# Patient Record
Sex: Male | Born: 1980 | Race: White | Hispanic: No | Smoking: Former smoker
Health system: Southern US, Community
[De-identification: ages and names within clinical notes are randomized; demographics above are authoritative.]

## PROBLEM LIST (undated history)

## (undated) DIAGNOSIS — I10 Essential (primary) hypertension: Secondary | ICD-10-CM

## (undated) DIAGNOSIS — F329 Major depressive disorder, single episode, unspecified: Secondary | ICD-10-CM

## (undated) DIAGNOSIS — F32A Depression, unspecified: Secondary | ICD-10-CM

---

## 2011-12-25 ENCOUNTER — Ambulatory Visit: Payer: Self-pay | Admitting: Family Medicine

## 2011-12-25 LAB — COMPREHENSIVE METABOLIC PANEL
Albumin: 3.9 g/dL (ref 3.4–5.0)
Alkaline Phosphatase: 73 U/L (ref 50–136)
Anion Gap: 10 (ref 7–16)
BUN: 11 mg/dL (ref 7–18)
Calcium, Total: 9.5 mg/dL (ref 8.5–10.1)
Creatinine: 0.92 mg/dL (ref 0.60–1.30)
Glucose: 105 mg/dL — ABNORMAL HIGH (ref 65–99)
Osmolality: 281 (ref 275–301)
Potassium: 4.4 mmol/L (ref 3.5–5.1)
SGPT (ALT): 92 U/L — ABNORMAL HIGH
Sodium: 141 mmol/L (ref 136–145)
Total Protein: 8.1 g/dL (ref 6.4–8.2)

## 2011-12-25 LAB — CBC WITH DIFFERENTIAL/PLATELET
Basophil %: 1.1 %
Eosinophil #: 0.3 10*3/uL (ref 0.0–0.7)
HCT: 45.4 % (ref 40.0–52.0)
HGB: 15.2 g/dL (ref 13.0–18.0)
Lymphocyte #: 3 10*3/uL (ref 1.0–3.6)
Lymphocyte %: 28.5 %
MCH: 29.3 pg (ref 26.0–34.0)
MCHC: 33.5 g/dL (ref 32.0–36.0)
MCV: 88 fL (ref 80–100)
Monocyte #: 1.2 x10 3/mm — ABNORMAL HIGH (ref 0.2–1.0)
RDW: 13.9 % (ref 11.5–14.5)
WBC: 10.6 10*3/uL (ref 3.8–10.6)

## 2011-12-25 LAB — TSH: Thyroid Stimulating Horm: 1.22 u[IU]/mL

## 2011-12-25 LAB — LIPID PANEL
Cholesterol: 180 mg/dL (ref 0–200)
HDL Cholesterol: 33 mg/dL — ABNORMAL LOW (ref 40–60)
Ldl Cholesterol, Calc: 124 mg/dL — ABNORMAL HIGH (ref 0–100)
VLDL Cholesterol, Calc: 23 mg/dL (ref 5–40)

## 2011-12-25 LAB — TROPONIN I: Troponin-I: <0.02 ng/mL

## 2011-12-31 ENCOUNTER — Ambulatory Visit: Payer: Self-pay | Admitting: Internal Medicine

## 2014-07-26 ENCOUNTER — Ambulatory Visit: Payer: Self-pay | Admitting: Physician Assistant

## 2014-08-17 ENCOUNTER — Ambulatory Visit: Payer: Self-pay | Admitting: Registered Nurse

## 2015-02-06 ENCOUNTER — Encounter: Payer: Self-pay | Admitting: Emergency Medicine

## 2015-02-06 ENCOUNTER — Emergency Department
Admission: EM | Admit: 2015-02-06 | Discharge: 2015-02-06 | Disposition: A | Discharge status: Home / Self Care | Payer: BLUE CROSS/BLUE SHIELD | Attending: Emergency Medicine | Admitting: Emergency Medicine

## 2015-02-06 DIAGNOSIS — S39012A Strain of muscle, fascia and tendon of lower back, initial encounter: Secondary | ICD-10-CM | POA: Diagnosis not present

## 2015-02-06 DIAGNOSIS — Y9289 Other specified places as the place of occurrence of the external cause: Secondary | ICD-10-CM | POA: Diagnosis not present

## 2015-02-06 DIAGNOSIS — Z880 Allergy status to penicillin: Secondary | ICD-10-CM | POA: Insufficient documentation

## 2015-02-06 DIAGNOSIS — Y9389 Activity, other specified: Secondary | ICD-10-CM | POA: Diagnosis not present

## 2015-02-06 DIAGNOSIS — Y998 Other external cause status: Secondary | ICD-10-CM | POA: Diagnosis not present

## 2015-02-06 DIAGNOSIS — M545 Low back pain: Secondary | ICD-10-CM | POA: Diagnosis present

## 2015-02-06 DIAGNOSIS — Z87891 Personal history of nicotine dependence: Secondary | ICD-10-CM | POA: Diagnosis not present

## 2015-02-06 DIAGNOSIS — X58XXXA Exposure to other specified factors, initial encounter: Secondary | ICD-10-CM | POA: Insufficient documentation

## 2015-02-06 LAB — URINALYSIS COMPLETE WITH MICROSCOPIC (ARMC ONLY)
BILIRUBIN URINE: NEGATIVE
Bacteria, UA: NONE SEEN
GLUCOSE, UA: NEGATIVE mg/dL
Hgb urine dipstick: NEGATIVE
KETONES UR: NEGATIVE mg/dL
Leukocytes, UA: NEGATIVE
Nitrite: NEGATIVE
Protein, ur: NEGATIVE mg/dL
SPECIFIC GRAVITY, URINE: 1.027 (ref 1.005–1.030)
Squamous Epithelial / LPF: NONE SEEN
pH: 5 (ref 5.0–8.0)

## 2015-02-06 MED ORDER — KETOROLAC TROMETHAMINE 10 MG PO TABS: 10.0000 mg | ORAL_TABLET | Freq: Three times a day (TID) | ORAL | Status: DC

## 2015-02-06 MED ORDER — KETOROLAC TROMETHAMINE 60 MG/2ML IM SOLN
60.0000 mg | Freq: Once | INTRAMUSCULAR | Status: AC
  Administered 2015-02-06: 60 mg via INTRAMUSCULAR
  Filled 2015-02-06: qty 2

## 2015-02-06 MED ORDER — ORPHENADRINE CITRATE 30 MG/ML IJ SOLN
60.0000 mg | INTRAMUSCULAR | Status: AC
  Administered 2015-02-06: 60 mg via INTRAMUSCULAR
  Filled 2015-02-06: qty 2

## 2015-02-06 MED ORDER — CYCLOBENZAPRINE HCL 5 MG PO TABS: 5.0000 mg | ORAL_TABLET | Freq: Three times a day (TID) | ORAL | Status: DC | PRN

## 2015-02-06 NOTE — ED Notes (Signed)
States he developed mid to lower back pain this am . Pain increases with sitting   is tender on palpation. Pain is non radiating.denies any trauma,fever or urinary sx's

## 2015-02-06 NOTE — Discharge Instructions (Signed)
Back Pain, Adult °Low back pain is very common. About 1 in 5 people have back pain. The cause of low back pain is rarely dangerous. The pain often gets better over time. About half of people with a sudden onset of back pain feel better in just 2 weeks. About 8 in 10 people feel better by 6 weeks.  °CAUSES °Some common causes of back pain include: °· Strain of the muscles or ligaments supporting the spine. °· Wear and tear (degeneration) of the spinal discs. °· Arthritis. °· Direct injury to the back. °DIAGNOSIS °Most of the time, the direct cause of low back pain is not known. However, back pain can be treated effectively even when the exact cause of the pain is unknown. Answering your caregiver's questions about your overall health and symptoms is one of the most accurate ways to make sure the cause of your pain is not dangerous. If your caregiver needs more information, he or she may order lab work or imaging tests (X-rays or MRIs). However, even if imaging tests show changes in your back, this usually does not require surgery. °HOME CARE INSTRUCTIONS °For many people, back pain returns. Since low back pain is rarely dangerous, it is often a condition that people can learn to manage on their own.  °· Remain active. It is stressful on the back to sit or stand in one place. Do not sit, drive, or stand in one place for more than 30 minutes at a time. Take short walks on level surfaces as soon as pain allows. Try to increase the length of time you walk each day. °· Do not stay in bed. Resting more than 1 or 2 days can delay your recovery. °· Do not avoid exercise or work. Your body is made to move. It is not dangerous to be active, even though your back may hurt. Your back will likely heal faster if you return to being active before your pain is gone. °· Pay attention to your body when you  bend and lift. Many people have less discomfort when lifting if they bend their knees, keep the load close to their bodies, and  avoid twisting. Often, the most comfortable positions are those that put less stress on your recovering back. °· Find a comfortable position to sleep. Use a firm mattress and lie on your side with your knees slightly bent. If you lie on your back, put a pillow under your knees. °· Only take over-the-counter or prescription medicines as directed by your caregiver. Over-the-counter medicines to reduce pain and inflammation are often the most helpful. Your caregiver may prescribe muscle relaxant drugs. These medicines help dull your pain so you can more quickly return to your normal activities and healthy exercise. °· Put ice on the injured area. °¨ Put ice in a plastic bag. °¨ Place a towel between your skin and the bag. °¨ Leave the ice on for 15-20 minutes, 03-04 times a day for the first 2 to 3 days. After that, ice and heat may be alternated to reduce pain and spasms. °· Ask your caregiver about trying back exercises and gentle massage. This may be of some benefit. °· Avoid feeling anxious or stressed. Stress increases muscle tension and can worsen back pain. It is important to recognize when you are anxious or stressed and learn ways to manage it. Exercise is a great option. °SEEK MEDICAL CARE IF: °· You have pain that is not relieved with rest or medicine. °· You have pain that does not improve in 1 week. °· You have new symptoms. °· You are generally not feeling well. °SEEK   IMMEDIATE MEDICAL CARE IF:  °· You have pain that radiates from your back into your legs. °· You develop new bowel or bladder control problems. °· You have unusual weakness or numbness in your arms or legs. °· You develop nausea or vomiting. °· You develop abdominal pain. °· You feel faint. °Document Released: 05/27/2005 Document Revised: 11/26/2011 Document Reviewed: 09/28/2013 °ExitCare® Patient Information ©2015 [employer]. This information is not intended to replace advice given to you by your health care provider. Make sure you  discuss any questions you have with your health care provider. ° °Lumbosacral Strain °Lumbosacral strain is a strain of any of the parts that make up your lumbosacral vertebrae. Your lumbosacral vertebrae are the bones that make up the lower third of your backbone. Your lumbosacral vertebrae are held together by muscles and tough, fibrous tissue (ligaments).  °CAUSES  °A sudden blow to your back can cause lumbosacral strain. Also, anything that causes an excessive stretch of the muscles in the low back can cause this strain. This is typically seen when people exert themselves strenuously, fall, lift heavy objects, bend, or crouch repeatedly. °RISK FACTORS °· Physically demanding work. °· Participation in pushing or pulling sports or sports that require a sudden twist of the back (tennis, golf, baseball). °· Weight lifting. °· Excessive lower back curvature. °· Forward-tilted pelvis. °· Weak back or abdominal muscles or both. °· Tight hamstrings. °SIGNS AND SYMPTOMS  °Lumbosacral strain may cause pain in the area of your injury or pain that moves (radiates) down your leg.  °DIAGNOSIS °Your health care provider can often diagnose lumbosacral strain through a physical exam. In some cases, you may need tests such as X-ray exams.  °TREATMENT  °Treatment for your lower back injury depends on many factors that your clinician will have to evaluate. However, most treatment will include the use of anti-inflammatory medicines. °HOME CARE INSTRUCTIONS  °· Avoid hard physical activities (tennis, racquetball, waterskiing) if you are not in proper physical condition for it. This may aggravate or create problems. °· If you have a back problem, avoid sports requiring sudden body movements. Swimming and walking are generally safer activities. °· Maintain good posture. °· Maintain a healthy weight. °· For acute conditions, you may put ice on the injured area. °¨ Put ice in a plastic bag. °¨ Place a towel between your skin and the  bag. °¨ Leave the ice on for 20 minutes, 2-3 times a day. °· When the low back starts healing, stretching and strengthening exercises may be recommended. °SEEK MEDICAL CARE IF: °· Your back pain is getting worse. °· You experience severe back pain not relieved with medicines. °SEEK IMMEDIATE MEDICAL CARE IF:  °· You have numbness, tingling, weakness, or problems with the use of your arms or legs. °· There is a change in bowel or bladder control. °· You have increasing pain in any area of the body, including your belly (abdomen). °· You notice shortness of breath, dizziness, or feel faint. °· You feel sick to your stomach (nauseous), are throwing up (vomiting), or become sweaty. °· You notice discoloration of your toes or legs, or your feet get very cold. °MAKE SURE YOU:  °· Understand these instructions. °· Will watch your condition. °· Will get help right away if you are not doing well or get worse. °Document Released: 03/06/2005 Document Revised: 06/01/2013 Document Reviewed: 01/13/2013 °ExitCare® Patient Information ©2015 [employer]. This information is not intended to replace advice given to you by your health care provider.   Make sure you discuss any questions you have with your health care provider.  Apply ice to reduce symptoms. Take the prescription meds as directed. Follow-up with [facility] as needed for ongoing symptoms.

## 2015-02-06 NOTE — ED Provider Notes (Signed)
[facility] Emergency Department Provider Note ____________________________________________  Time seen: 0835  I have reviewed the triage vital signs and the nursing notes.  HISTORY  Chief Complaint  Back Pain  HPI Alan Norris is a 34 y.o. male reports to the ED for evaluation management of low back pain that developed this morning upon awakening. He denies any injury trauma, or urinary symptoms. He describes the pain does not radiate into the lower extremities or abdomen. He notes  the pain is increased with prolonged sitting. He rates pain at a 7/10 in triage.  History reviewed. No pertinent past medical history.  There are no active problems to display for this patient.   History reviewed. No pertinent past surgical history.  Current Outpatient Rx  Name  Route  Sig  Dispense  Refill  . cyclobenzaprine (FLEXERIL) 5 MG tablet   Oral   Take 1 tablet (5 mg total) by mouth every 8 (eight) hours as needed for muscle spasms.   12 tablet   0   . ketorolac (TORADOL) 10 MG tablet   Oral   Take 1 tablet (10 mg total) by mouth every 8 (eight) hours.   15 tablet   0    Allergies Amoxicillin  No family history on file.  Social History Social History  Substance Use Topics  . Smoking status: Former Games developer  . Smokeless tobacco: None  . Alcohol Use: Yes    Review of Systems  Constitutional: Negative for fever. Eyes: Negative for visual changes. ENT: Negative for sore throat. Cardiovascular: Negative for chest pain. Respiratory: Negative for shortness of breath. Gastrointestinal: Negative for abdominal pain, vomiting and diarrhea. Genitourinary: Negative for dysuria. Musculoskeletal: Positive for back pain. Skin: Negative for rash. Neurological: Negative for headaches, focal weakness or numbness. ____________________________________________  PHYSICAL EXAM:  VITAL SIGNS: ED Triage Vitals  Enc Vitals Group     BP 02/06/15 0820  131/105 mmHg     Pulse Rate 02/06/15 0820 89     Resp 02/06/15 0820 18     Temp 02/06/15 0820 98.8 F (37.1 C)     Temp Source 02/06/15 0820 Oral     SpO2 02/06/15 0820 97 %     Weight 02/06/15 0820 280 lb (127.007 kg)     Height 02/06/15 0820  (1.88 m)     Head Cir --      Peak Flow --      Pain Score 02/06/15 0820 7     Pain Loc --      Pain Edu? --      Excl. in GC? --    Constitutional: Alert and oriented. Well appearing and in no distress. Eyes: Conjunctivae are normal. PERRL. Normal extraocular movements. ENT   Head: Normocephalic and atraumatic.   Nose: No congestion/rhinnorhea.   Mouth/Throat: Mucous membranes are moist.   Neck: Supple. No thyromegaly. Hematological/Lymphatic/Immunilogical: No cervical lymphadenopathy. Cardiovascular: Normal rate, regular rhythm.  Respiratory: Normal respiratory effort. No wheezes/rales/rhonchi. Gastrointestinal: Soft and nontender. No distention. No CVA tenderness Musculoskeletal: Normal spinal alignment without spasm, deformity, or step-off. Exquisitely tender to light touch along the thoracolumbar spine. No abrasion, bruising, or ecchymosis. Normal spinal ROM. Negative SLR bilaterally. Normal toe/heel raise. Normal range of motion in all extremities.  Neurologic:  CN II-XII grossly intact. Normal LE DTRs bilaterally. Normal gait without ataxia. Normal speech and language. No gross focal neurologic deficits are appreciated. Skin:  Skin is warm, dry and intact. No rash noted. Psychiatric: Mood and affect are normal.  Patient exhibits appropriate insight and judgment. ____________________________________________   LABS (pertinent positives/negatives) Labs Reviewed  URINALYSIS COMPLETEWITH MICROSCOPIC ([facility] ONLY) - Abnormal; Notable for the following:    Color, Urine YELLOW (*)    APPearance CLEAR (*)    All other components within normal limits  ____________________________________________  PROCEDURES  Toradol 60 mg  IM Norflex 60 mg IM ____________________________________________  INITIAL IMPRESSION / ASSESSMENT AND PLAN / ED COURSE  Mechanical LBP likely delayed onset from work activities. Will treat with Flexeril and Toradol for musculoskeletal strain. Follow-up with [facility] as needed. Work note for [employer] today. ____________________________________________  FINAL CLINICAL IMPRESSION(S) / ED DIAGNOSES  Final diagnoses:  Lumbar strain, initial encounter     Lissa Hoard, PA-C 02/06/15 1021  Sharman Cheek, MD 02/06/15 [phone]

## 2015-02-06 NOTE — ED Notes (Signed)
States pain is better but still having some muscle tightness

## 2015-02-06 NOTE — ED Notes (Signed)
C/o lower back pain since waking this am, denies any injury

## 2015-02-18 ENCOUNTER — Emergency Department
Admission: EM | Admit: 2015-02-18 | Discharge: 2015-02-19 | Disposition: A | Discharge status: Rehabilitation Facility | Payer: BLUE CROSS/BLUE SHIELD | Attending: Student | Admitting: Student

## 2015-02-18 ENCOUNTER — Encounter: Payer: Self-pay | Admitting: Emergency Medicine

## 2015-02-18 DIAGNOSIS — F32A Depression, unspecified: Secondary | ICD-10-CM

## 2015-02-18 DIAGNOSIS — Z880 Allergy status to penicillin: Secondary | ICD-10-CM | POA: Insufficient documentation

## 2015-02-18 DIAGNOSIS — F329 Major depressive disorder, single episode, unspecified: Secondary | ICD-10-CM

## 2015-02-18 DIAGNOSIS — Z87891 Personal history of nicotine dependence: Secondary | ICD-10-CM | POA: Insufficient documentation

## 2015-02-18 DIAGNOSIS — F319 Bipolar disorder, unspecified: Secondary | ICD-10-CM | POA: Insufficient documentation

## 2015-02-18 HISTORY — DX: Major depressive disorder, single episode, unspecified: F32.9

## 2015-02-18 HISTORY — DX: Depression, unspecified: F32.A

## 2015-02-18 LAB — CBC
HEMATOCRIT: 45 % (ref 40.0–52.0)
Hemoglobin: 15 g/dL (ref 13.0–18.0)
MCH: 28.7 pg (ref 26.0–34.0)
MCHC: 33.3 g/dL (ref 32.0–36.0)
MCV: 86.1 fL (ref 80.0–100.0)
Platelets: 381 10*3/uL (ref 150–440)
RBC: 5.22 MIL/uL (ref 4.40–5.90)
RDW: 13.7 % (ref 11.5–14.5)
WBC: 11.8 10*3/uL — ABNORMAL HIGH (ref 3.8–10.6)

## 2015-02-18 LAB — URINE DRUG SCREEN, QUALITATIVE (ARMC ONLY)
Amphetamines, Ur Screen: NOT DETECTED
BARBITURATES, UR SCREEN: NOT DETECTED
BENZODIAZEPINE, UR SCRN: NOT DETECTED
Cannabinoid 50 Ng, Ur ~~LOC~~: NOT DETECTED
Cocaine Metabolite,Ur ~~LOC~~: NOT DETECTED
MDMA (Ecstasy)Ur Screen: NOT DETECTED
METHADONE SCREEN, URINE: NOT DETECTED
OPIATE, UR SCREEN: NOT DETECTED
PHENCYCLIDINE (PCP) UR S: NOT DETECTED
Tricyclic, Ur Screen: NOT DETECTED

## 2015-02-18 LAB — COMPREHENSIVE METABOLIC PANEL
ALBUMIN: 4.2 g/dL (ref 3.5–5.0)
ALK PHOS: 58 U/L (ref 38–126)
ALT: 55 U/L (ref 17–63)
ANION GAP: 8 (ref 5–15)
AST: 40 U/L (ref 15–41)
BILIRUBIN TOTAL: 0.3 mg/dL (ref 0.3–1.2)
BUN: 15 mg/dL (ref 6–20)
CALCIUM: 9.4 mg/dL (ref 8.9–10.3)
CO2: 27 mmol/L (ref 22–32)
Chloride: 103 mmol/L (ref 101–111)
Creatinine, Ser: 0.93 mg/dL (ref 0.61–1.24)
GLUCOSE: 135 mg/dL — AB (ref 65–99)
Potassium: 4 mmol/L (ref 3.5–5.1)
Sodium: 138 mmol/L (ref 135–145)
TOTAL PROTEIN: 7.8 g/dL (ref 6.5–8.1)

## 2015-02-18 LAB — ACETAMINOPHEN LEVEL

## 2015-02-18 LAB — SALICYLATE LEVEL: Salicylate Lvl: <4 mg/dL (ref 2.8–30.0)

## 2015-02-18 LAB — ETHANOL: Alcohol, Ethyl (B): <5 mg/dL (ref <5)

## 2015-02-18 MED ORDER — QUETIAPINE FUMARATE 25 MG PO TABS
100.0000 mg | ORAL_TABLET | Freq: Every day | ORAL | Status: DC
  Administered 2015-02-18: 100 mg via ORAL
  Filled 2015-02-18: qty 4

## 2015-02-18 NOTE — ED Notes (Signed)
Patient unable to contact wife

## 2015-02-18 NOTE — ED Notes (Signed)
BEHAVIORAL HEALTH ROUNDING Patient sleeping: No. Patient alert and oriented: yes Behavior appropriate: Yes.  ; If no, describe:  Nutrition and fluids offered: Yes  Toileting and hygiene offered: Yes  Sitter present: yes Law enforcement present: Yes  

## 2015-02-18 NOTE — ED Notes (Signed)
Patient asked for phone to call wife.  Phone given to patient to call wife.

## 2015-02-18 NOTE — BH Assessment (Signed)
Assessment Note  Alan Norris is an 34 y.o. male presenting to the ED voluntarily for "worsening depression".  Pt reports multiple stressors but did not elaborate as to what specifically is causing him stress.  Pt reports he recently started receiving treatment for depression with Dr. Nolen Mu.  He reports being prescribed fluoxetine and has been on it for the past 7 days.  Pt denies any previous hospitalizations.  Pt denies SI/HI and any A/V hallucinations.  Pt denies any drug/alcohol use.  Pt reports he has two children and enjoys attending their extracurricular activities.  Axis I: Depressive Disorder NOS Axis II: Deferred Axis III:  Past Medical History  Diagnosis Date  . Depression    Axis IV: problems with primary support group Axis V: 61-70 mild symptoms  Past Medical History:  Past Medical History  Diagnosis Date  . Depression     History reviewed. No pertinent past surgical history.  Family History: No family history on file.  Social History:  reports that he has quit smoking. He does not have any smokeless tobacco history on file. He reports that he does not drink alcohol or use illicit drugs.  Additional Social History:  Alcohol / Drug Use History of alcohol / drug use?: No history of alcohol / drug abuse  CIWA: CIWA-Ar BP: (!) 142/96 mmHg Pulse Rate: (!) 104 COWS:    Allergies:  Allergies  Allergen Reactions  . Amoxicillin     unknown    Home Medications:  (Not in a hospital admission)  OB/GYN Status:  No LMP for male patient.  General Assessment Data Location of Assessment: [facility] ED TTS Assessment: In system Is this a Tele or Face-to-Face Assessment?: Face-to-Face Is this an Initial Assessment or a Re-assessment for this encounter?: Initial Assessment Marital status: Married Girard name: N/A Is patient pregnant?: No Pregnancy Status: No Living Arrangements: Spouse/significant other Can pt return to current living arrangement?:  Yes Admission Status: Voluntary Is patient capable of signing voluntary admission?: Yes Referral Source: Self/Family/Friend Insurance type: BC/BS     Crisis Care Plan Living Arrangements: Spouse/significant other Name of Psychiatrist: Dr. Nolen Mu Name of Therapist: Dr. Nolen Mu  Education Status Is patient currently in school?: No Current Grade: N/A Highest grade of school patient has completed: 12 Contact person: N/A  Risk to self with the past 6 months Suicidal Ideation: No Has patient been a risk to self within the past 6 months prior to admission? : No Suicidal Intent: No Has patient had any suicidal intent within the past 6 months prior to admission? : No Is patient at risk for suicide?: No Suicidal Plan?: No Has patient had any suicidal plan within the past 6 months prior to admission? : No Access to Means: No What has been your use of drugs/alcohol within the last 12 months?: None reported Previous Attempts/Gestures: No How many times?: 0 Other Self Harm Risks: None reported Triggers for Past Attempts: Other (Comment) (None reported) Intentional Self Injurious Behavior: None Family Suicide History: No Recent stressful life event(s): Other (Comment) Persecutory voices/beliefs?: No Depression: Yes Depression Symptoms: Isolating, Guilt, Loss of interest in usual pleasures, Feeling worthless/self pity, Feeling angry/irritable Substance abuse history and/or treatment for substance abuse?: No Suicide prevention information given to non-admitted patients: Not applicable  Risk to Others within the past 6 months Homicidal Ideation: No Does patient have any lifetime risk of violence toward others beyond the six months prior to admission? : No Thoughts of Harm to Others: No Current Homicidal Intent: No Current Homicidal  Plan: No Access to Homicidal Means: No Identified Victim: None reported History of harm to others?: No Assessment of Violence: On admission Violent  Behavior Description: None reported Does patient have access to weapons?: No Criminal Charges Pending?: No Does patient have a court date: No Is patient on probation?: No  Psychosis Hallucinations: None noted Delusions: None noted  Mental Status Report Appearance/Hygiene: In scrubs Eye Contact: Fair Motor Activity: Freedom of movement Speech: Logical/coherent, Soft Level of Consciousness: Quiet/awake Mood: Depressed, Anxious, Sad Affect: Anxious, Sad, Depressed Anxiety Level: Minimal Thought Processes: Coherent Judgement: Unimpaired Orientation: Person, Place, Time, Situation Obsessive Compulsive Thoughts/Behaviors: None  Cognitive Functioning Concentration: Normal Memory: Recent Intact IQ: Average Insight: Fair Impulse Control: Fair Appetite: Fair Weight Loss: 0 Weight Gain: 0 Sleep: No Change Total Hours of Sleep: 8 Vegetative Symptoms: None  ADLScreening [facility] Assessment Services) Patient's cognitive ability adequate to safely complete daily activities?: Yes Patient able to express need for assistance with ADLs?: Yes Independently performs ADLs?: Yes (appropriate for developmental age)  Prior Inpatient Therapy Prior Inpatient Therapy: No  Prior Outpatient Therapy Prior Outpatient Therapy: No Does patient have an ACCT team?: No Does patient have Intensive In-House Services?  : No Does patient have [facility] services? : No Does patient have P4CC services?: No  ADL Screening (condition at time of admission) Patient's cognitive ability adequate to safely complete daily activities?: Yes Patient able to express need for assistance with ADLs?: Yes Independently performs ADLs?: Yes (appropriate for developmental age)       Abuse/Neglect Assessment (Assessment to be complete while patient is alone) Physical Abuse: Denies Verbal Abuse: Denies Sexual Abuse: Denies Exploitation of patient/patient's resources: Denies Self-Neglect: Denies Values / Beliefs Cultural  Requests During Hospitalization: None Spiritual Requests During Hospitalization: None Consults Spiritual Care Consult Needed: No Social Work Consult Needed: No      Additional Information 1:1 In Past 12 Months?: No CIRT Risk: No Elopement Risk: No     Disposition:  Disposition Initial Assessment Completed for this Encounter: Yes Disposition of Patient: Other dispositions Other disposition(s): Other (Comment) (Psych MD consult)  On Site Evaluation by:   Reviewed with Physician:    [place] 02/18/2015 8:33 PM

## 2015-02-18 NOTE — ED Notes (Signed)
Wife reports was recently diagnosed with depression about 1 week ago. Wife states he has had episodes of feeling depressed then really angry. Wife states he told her that he wanted to kill everyone. Pt states everyone says that they can help me but they don't

## 2015-02-18 NOTE — ED Notes (Signed)
SOC speaking with Dr. Pershing Proud on phone

## 2015-02-18 NOTE — ED Notes (Signed)
Received call from wife.  Wife tearful.  Stating that patient kept calling her at home, over and over again.  Wife asked for an update in care.  Explained that [facility] consult had been made and we are awaiting psychiatry physician to be available.  Wife upset and tearful.  Stating that she did not know what to say to patient when he repeatedly asked her to let him return home.  Reassured wife of plan of care and that patient was being cared for.  Well received.  Involved Charge Nurse, Fleet Contras, to go to patient's room and reinforce plan of care and remove telephone from room.  Plan of care explained.  Patient not listening. Stating that nothing was being done, he is unhappy with the room, unhappy with wait time to speak with psychiatrist.  Telephone removed from room, explaining to patient calling hours were over.  Patient verbalized his disagreement with rules.  Patient requested a glass of water. When returning with water, patient standing outside of room.  Insisting to speak to wife.  Patient asked to return to room.  Patient refused, insisting on speaking with wife.  Danelle Earthly, RN joined conversation, again Administrator, sports, plan of care. Patient again refused to return to room.  Dr. Pershing Proud included in conversation.  Patient finally returned to room.  Patient observed to be crying in room shortly after returning to room.

## 2015-02-18 NOTE — ED Notes (Signed)

## 2015-02-18 NOTE — ED Provider Notes (Signed)
[facility] Emergency Department Provider Note  ____________________________________________  Time seen: Approximately 605 PM  I have reviewed the triage vital signs and the nursing notes.   HISTORY  Chief Complaint Psychiatric Evaluation    HPI Alan Norris is a 34 y.o. male with a history of depression who is presenting today voluntarilybecause of increased depression and anger. The patient is not forthcoming with any details but he says that he has significant family stressors and also stressors from his job. He says the part of the stress comes from not having a job anymore which is something that happened recently. He denies any suicidal or homicidal ideation. The patient also says that he has been taking fluoxetine for one day and has seen a psychiatrist recently who wrote him this prescription. However, he feels that his feelings are not improving and this is why he agreed to come to the emergency department. Upon speaking with his wife, [name], she says that he has had erratic behavior over the last 3 months. She says that he recently bought a new car unexpectedly without telling her. Also picked up their child from preschool and drove to his parents home in [place] unexpectedly. Quit his job unexpectedly several days ago. Per his wife, he is also made some suicidal and homicidal statements saying that he will kill everyone. The wife says that they do have guns in the home and this makes her concerned for the safety and well-being of the family. She does not know any particular factor that would have made him act so differently over the past 3 months. She says that he had been enjoying his job and does not know of any drug use or extramarital affair.   Past Medical History  Diagnosis Date  . Depression     There are no active problems to display for this patient.   History reviewed. No pertinent past surgical history.  Current Outpatient Rx  Name   Route  Sig  Dispense  Refill  . cyclobenzaprine (FLEXERIL) 5 MG tablet   Oral   Take 1 tablet (5 mg total) by mouth every 8 (eight) hours as needed for muscle spasms.   12 tablet   0   . ketorolac (TORADOL) 10 MG tablet   Oral   Take 1 tablet (10 mg total) by mouth every 8 (eight) hours.   15 tablet   0     Allergies Amoxicillin  No family history on file.  Social History Social History  Substance Use Topics  . Smoking status: Former Games developer  . Smokeless tobacco: None  . Alcohol Use: No    Review of Systems Constitutional: No fever/chills Eyes: No visual changes. ENT: No sore throat. Cardiovascular: Denies chest pain. Respiratory: Denies shortness of breath. Gastrointestinal: No abdominal pain.  No nausea, no vomiting.  No diarrhea.  No constipation. Genitourinary: Negative for dysuria. Musculoskeletal: Negative for back pain. Skin: Negative for rash. Neurological: Negative for headaches, focal weakness or numbness.  10-point ROS otherwise negative.  ____________________________________________   PHYSICAL EXAM:  VITAL SIGNS: ED Triage Vitals  Enc Vitals Group     BP 02/18/15 1726 142/96 mmHg     Pulse Rate 02/18/15 1726 104     Resp 02/18/15 1726 18     Temp 02/18/15 1726 98.7 F (37.1 C)     Temp Source 02/18/15 1726 Oral     SpO2 02/18/15 1726 96 %     Weight 02/18/15 1726 300 lb (136.079 kg)  Height 02/18/15 1726 6' (1.829 m)     Head Cir --      Peak Flow --      Pain Score --      Pain Loc --      Pain Edu? --      Excl. in GC? --     Constitutional: Alert and oriented. Well appearing and in no acute distress. Eyes: Conjunctivae are normal. PERRL. EOMI. Head: Atraumatic. Nose: No congestion/rhinnorhea. Mouth/Throat: Mucous membranes are moist.  Oropharynx non-erythematous. Neck: No stridor.   Cardiovascular: Normal rate, regular rhythm. Grossly normal heart sounds.  Good peripheral circulation. Respiratory: Normal respiratory effort.   No retractions. Lungs CTAB. Gastrointestinal: Soft and nontender. No distention. No abdominal bruits. No CVA tenderness. Musculoskeletal: No lower extremity tenderness nor edema.  No joint effusions. Neurologic:  Normal speech and language. No gross focal neurologic deficits are appreciated. No gait instability. Skin:  Skin is warm, dry and intact. No rash noted. Psychiatric: Speaks in short sentences. He appears agitated. Does not make eye contact. Is agreeable to exam, but not very forthcoming with details.  ____________________________________________   LABS (all labs ordered are listed, but only abnormal results are displayed)  Labs Reviewed  COMPREHENSIVE METABOLIC PANEL - Abnormal; Notable for the following:    Glucose, Bld 135 (*)    All other components within normal limits  ACETAMINOPHEN LEVEL - Abnormal; Notable for the following:    Acetaminophen (Tylenol), Serum <10 (*)    All other components within normal limits  CBC - Abnormal; Notable for the following:    WBC 11.8 (*)    All other components within normal limits  ETHANOL  SALICYLATE LEVEL  URINE DRUG SCREEN, QUALITATIVE ([facility] ONLY)   ____________________________________________  EKG   ____________________________________________  RADIOLOGY   ____________________________________________   PROCEDURES    ____________________________________________   INITIAL IMPRESSION / ASSESSMENT AND PLAN / ED COURSE  Pertinent labs & imaging results that were available during my care of the patient were reviewed by me and considered in my medical decision making (see chart for details).  I discussed with the patient that he will need to stay overnight to be evaluated by the psychiatrist tomorrow. He understands the psychiatrist that he right now and this is why he needs to stay in the emergency department. At the current time he is here voluntarily and agreeable to stay. However, he will need to be involuntarily  committed if he makes any gestures towards leaving the emergency department. He is currently not having any aggressive behavior and is denying any suicidal or homicidal ideation. However, He is too high risk to be able to go home prior to psychiatric evaluation. I did offer him some anti-anxiety medicines to help him relax which he has refused.  ----------------------------------------- 7:31 PM on 02/18/2015 -----------------------------------------  Patient at this point is saying that he would like to leave and that he does not want to stay any longer to wait to be seen by the psychiatrist. He is telling us that we have no right to keep him and that he will call and attorney. He is claiming that the longer we keep him the more money we make any sites of pill that he received when his son broke his wrist for $1000. I assured him that we are not keeping him in the emergency department for any sort of financial reason. However, he accused the nurse and I both lying to him when we told him this. The patient continues to say  that he is not going to hurt anybody. However, after speaking with his wife and I still feel that it is highly risky to discharge him to home with weapons in the house. Especially with the behavior that his wife had described to me on the phone. He is agreeable to wait for a tele-psychiatry consult.  ----------------------------------------- 9:07 PM on 02/18/2015 -----------------------------------------  Patient became agitated again while waiting for tele psychiatry consult. Is walking outside of the room and requesting again to be discharged. He is asking to speak with his wife but per the nurse he has repeatedly called his wife on the emergency department. He needed to have his phone taken away from him because of this. At this point because of repeated uncooperative behavior as well as concerning behavior at home I completed involuntary commitment papers on  him.  ----------------------------------------- 11:56 PM on 02/18/2015 -----------------------------------------  Patient evaluated by tele-psychiatry.  Dr. Jonelle Sidle recommends admission and starting the patient on Seroquel. The patient is not objecting to admission and took his Seroquel without resistance. ____________________________________________   FINAL CLINICAL IMPRESSION(S) / ED DIAGNOSES   Acute bipolar 1 disorder. Initial visit.    Myrna Blazer, MD 02/18/15 [phone]

## 2015-02-18 NOTE — ED Notes (Signed)
[facility] consult complete. Patient in room crying / sobbing.

## 2015-02-18 NOTE — ED Notes (Signed)
BEHAVIORAL HEALTH ROUNDING Patient sleeping: No   Patient alert and oriented: Yes   Behavior appropriate: No ; If no, describe: Easy to anger, unwilling to listen to explanations r/t plan of care, refusing requests to follow rules of care area. Nutrition and fluids offered: Yes  Toileting and hygiene offered: Yes Sitter present: Yes  Law enforcement present: Yes

## 2015-02-18 NOTE — ED Notes (Signed)
BEHAVIORAL HEALTH ROUNDING Patient sleeping: No   Patient alert and oriented: Yes  Behavior appropriate: no ; If no, describe: Patient remains anxious and easy to anger. Nutrition and fluids offered: yes   Toileting and hygiene offered: Yes  Sitter present: Yes  Law enforcement present: Yes

## 2015-02-18 NOTE — ED Notes (Signed)
Patient upset.  Unable to contact wife.  Patient upset stating patient would like to be discharged and go home. Dr. Pershing Proud alerted and to bedside.  Discussion with patient re concerns for patient safety and patient's wife's safety concerns.  Patient states he does not wish to stay in Ed.  States he would like to go home tonight and return tomorrow to see the Psychiatrist.  [facility] consult offered to patient has a compromise, patient agrees to stay in ED for one hour for [facility] consult.

## 2015-02-18 NOTE — ED Notes (Signed)
Wife states this is not her husband. States he told her that he would like to kill everyone. Pt has told her people tell him that they can help him and don't.  Please call wife with any questions or concerns.  [name]  [phone]

## 2015-02-18 NOTE — ED Notes (Signed)
BEHAVIORAL HEALTH ROUNDING Patient sleeping: No  Patient alert and oriented: Yes  Behavior appropriate: No  ; If no, describe: Patient very easy to anger.  Insistent on "his way" otherwise patient demanding discharge.  Explaination of plan of care not helpful to de-escalate patient.   Nutrition and fluids offered: Yes   Toileting and hygiene offered: Yes   Sitter present: Yes  Law enforcement present: Yes

## 2015-02-18 NOTE — ED Notes (Signed)
BEHAVIORAL HEALTH ROUNDING Patient sleeping: No. Patient alert and oriented: yes Behavior appropriate: No; If no, describe: Patient is withdrawn, focused, and presents with a flat affect Nutrition and fluids offered: Pending MD Eval Toileting and hygiene offered: Yes  Sitter present: yes Law enforcement present: Yes   ENVIRONMENTAL ASSESSMENT Potentially harmful objects out of patient reach: Yes.   Personal belongings secured: Yes.   Patient dressed in hospital provided attire only: Yes.   Plastic bags out of patient reach: Yes.   Patient care equipment (cords, cables, call bells, lines, and drains) shortened, removed, or accounted for: Yes.   Equipment and supplies removed from bottom of stretcher: Yes.   Potentially toxic materials out of patient reach: Yes.   Sharps container removed or out of patient reach: Yes.

## 2015-02-19 ENCOUNTER — Encounter: Payer: Self-pay | Admitting: Emergency Medicine

## 2015-02-19 NOTE — ED Notes (Signed)
BEHAVIORAL HEALTH ROUNDING Patient sleeping: Yes.   Patient alert and oriented: yes Behavior appropriate: Yes.  ; If no, describe:  Nutrition and fluids offered: Yes  Toileting and hygiene offered: Yes  Sitter present: yes Law enforcement present: Yes  

## 2015-02-19 NOTE — ED Notes (Signed)
NAD noted at this time. Pt requesting this RN to speak to wife, verbal permission given to this RN, witnessed by [place], [place].

## 2015-02-19 NOTE — ED Notes (Signed)
MD at bedside. 

## 2015-02-19 NOTE — ED Notes (Signed)
ENVIRONMENTAL ASSESSMENT Potentially harmful objects out of patient reach: Yes Personal belongings secured: Yes Patient dressed in hospital provided attire only: Yes Plastic bags out of patient reach: Yes Patient care equipment (cords, cables, call bells, lines, and drains) shortened, removed, or accounted for: Yes Equipment and supplies removed from bottom of stretcher: Yes Potentially toxic materials out of patient reach: Yes Sharps container removed or out of patient reach: Yes  Patient assigned to appropriate care area. Patient oriented to unit/care area: Informed that, for their safety, care areas are designed for safety and monitored by security cameras at all times; and visiting hours explained to patient. Patient verbalizes understanding, and verbal contract for safety obtained. Maintained on 15 minute checks and observation by security camera for safety. 

## 2015-02-19 NOTE — Progress Notes (Addendum)
Call from [facility] spoke to Rep. Tammy, willing to make a bed offer for patient.  Accepting MD is Dr. Guss Bunde.  Call report to [phone].  RN, Sec and MD notified.  Patient has been informed that he will going to [place].   Sammuel Hines. Theresia Majors, MSW Clinical Social Work Department Emergency Room [phone] 3:52 PM

## 2015-02-19 NOTE — ED Notes (Signed)
BEHAVIORAL HEALTH ROUNDING Patient sleeping: Yes.   Patient alert and oriented: sleeping Behavior appropriate: Yes.  ; If no, describe: sleeping Nutrition and fluids offered: sleeping Toileting and hygiene offered: sleeping Sitter present: no Law enforcement present: Yes  

## 2015-02-19 NOTE — ED Notes (Signed)
Meal served 

## 2015-02-19 NOTE — ED Notes (Signed)
BEHAVIORAL HEALTH ROUNDING Patient sleeping: No. Patient alert and oriented: yes Behavior appropriate: Yes.  ; If no, describe:  Nutrition and fluids offered: Yes Toileting and hygiene offered: Yes  Sitter present: yes Law enforcement present: Yes ODS  

## 2015-02-19 NOTE — ED Notes (Addendum)
Pt laying in bed with the lights off. NAD noted at this time, respirations even and unlabored.

## 2015-02-19 NOTE — ED Provider Notes (Signed)
-----------------------------------------   4:29 PM on 02/19/2015 -----------------------------------------   Blood pressure 131/96, pulse 101, temperature 98.4 F (36.9 C), temperature source Oral, resp. rate 18, height 6' (1.829 m), weight 300 lb (136.079 kg), SpO2 93 %.  The patient had no acute events since last update.  Calm and cooperative at this time.  Will be discharged to psychiatric hospital for inpatient treatment.   Myrna Blazer, MD 02/19/15 [phone]

## 2015-02-19 NOTE — ED Notes (Signed)
ED BHU PLACEMENT JUSTIFICATION Is the patient under IVC or is there intent for IVC: Yes.   Is the patient medically cleared: Yes.   Is there vacancy in the ED BHU: Yes.   Is the population mix appropriate for patient: Yes.   Is the patient awaiting placement in inpatient or outpatient setting: No. Has the patient had a psychiatric consult: Yes.   Survey of unit performed for contraband, proper placement and condition of furniture, tampering with fixtures in bathroom, shower, and each patient room: Yes.  ; Findings:  APPEARANCE/BEHAVIOR calm, cooperative and adequate rapport can be established NEURO ASSESSMENT Orientation: time, place, person Hallucinations: No.None noted (Hallucinations) Speech: Normal Gait: normal RESPIRATORY ASSESSMENT Normal expansion.  Clear to auscultation.  No rales, rhonchi, or wheezing. CARDIOVASCULAR ASSESSMENT regular rate and rhythm, S1, S2 normal, no murmur, click, rub or gallop GASTROINTESTINAL ASSESSMENT soft, nontender, BS WNL, no r/g EXTREMITIES normal strength, tone, and muscle mass PLAN OF CARE Provide calm/safe environment. Vital signs assessed twice daily. ED BHU Assessment once each 12-hour shift. Collaborate with intake RN daily or as condition indicates. Assure the ED provider has rounded once each shift. Provide and encourage hygiene. Provide redirection as needed. Assess for escalating behavior; address immediately and inform ED provider.  Assess family dynamic and appropriateness for visitation as needed: Yes.  ; If necessary, describe findings:  Educate the patient/family about BHU procedures/visitation: Yes.  ; If necessary, describe findings:

## 2015-02-19 NOTE — ED Notes (Signed)
Patient calm and cooperative. He has come out of his room to the dayroom briefly. In good behavioral control. Will maintain on 15 minute checks and observation by security cameras for safety.

## 2015-02-19 NOTE — ED Notes (Signed)
BEHAVIORAL HEALTH ROUNDING Patient sleeping: Yes.   Patient alert and oriented: UTA, pt has been sleeping during this RN's shift. Behavior appropriate: Yes.  ; If no, describe:  Nutrition and fluids offered: Yes  Toileting and hygiene offered: Yes  Sitter present: yes Law enforcement present: Yes ODS

## 2015-02-19 NOTE — ED Notes (Signed)
Patient transferred via [facility] to [facility] for inpatient care. He denies SI or HI. Cooperative with transfer. Patient received all personal belongings.

## 2015-02-19 NOTE — Progress Notes (Signed)
Patient evaluated by [facility] with recommendation for inpatient psych treatment.  No beds available at the following hospitals: [facility], [facility], [place], [address] and [place].  One bed available at [facility] and [facility].  CSW faxed referral to both Hospitals.   CSW will continue to follow patient to assist with placement for first available inpatient bed  Sammuel Hines. Theresia Majors, MSW Clinical Social Work Department Emergency Room [phone] 10:00 AM

## 2015-02-19 NOTE — ED Notes (Addendum)
BEHAVIORAL HEALTH ROUNDING Patient sleeping: Yes Patient alert and oriented: UTA due to pt sleeping Behavior appropriate: Yes.  ; If no, describe:  Nutrition and fluids offered: Yes  Toileting and hygiene offered: Yes  Sitter present: yes Law enforcement present: Yes ODS

## 2015-02-19 NOTE — Consult Note (Signed)
  Pt  Seen in [facility] - ER - BHU on 02/19/2015 for  Follow up Consult. S Pt is a 34 yr old white male who is not employed and is married and lives with his wife who is a Engineer, building services. Pt comes to ER brought by wife because of recent onset of impulsive  behavior where he quit his job and went to [place] with one of his kids for no reason.  Past psych history none. M.s. Alert and ox3. Appears anxious and is depressed. No agitation. Does admit to impulsive behavior for no reason which is of concern to him.  Denies a/v hallucinations or delusions but has no reason or idea about his behavior which is getting worse. Denies s/h ideas or plans. I/J guarded. Impulse control is poor. Imp Bi-polar disorder Hypo manic. REc Inpt to Psychiatry and pt is accepted at [facility] in [place],.

## 2015-02-19 NOTE — ED Notes (Signed)

## 2015-02-19 NOTE — ED Notes (Signed)
NAD noted at this time. Pt noted to be awake. Calm and cooperative, sitting on side of bed. Pt requests use of the phone, pt given phone at this time.

## 2015-02-19 NOTE — ED Notes (Signed)
BEHAVIORAL HEALTH ROUNDING Patient sleeping: No. Patient alert and oriented: yes Behavior appropriate: Yes.  ; If no, describe:  Nutrition and fluids offered: Yes  Toileting and hygiene offered: Yes  Sitter present: yes Law enforcement present: Yes  

## 2018-04-15 ENCOUNTER — Ambulatory Visit: Payer: Self-pay | Admitting: Psychiatry

## 2018-08-25 ENCOUNTER — Other Ambulatory Visit: Payer: Self-pay

## 2018-08-25 ENCOUNTER — Ambulatory Visit: Payer: PRIVATE HEALTH INSURANCE | Admitting: Mental Health

## 2018-08-25 ENCOUNTER — Ambulatory Visit: Payer: Self-pay | Admitting: Psychiatry

## 2018-08-25 DIAGNOSIS — F319 Bipolar disorder, unspecified: Secondary | ICD-10-CM | POA: Diagnosis not present
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
## 2018-09-01 ENCOUNTER — Telehealth: Payer: Self-pay | Admitting: Psychiatry

## 2018-09-01 NOTE — Telephone Encounter (Signed)
Left voicemail to clarify dosage with front office staff for his lamictal

## 2018-09-01 NOTE — Telephone Encounter (Signed)
Need to clarify dose not seen in epic yet, ,

## 2018-09-01 NOTE — Telephone Encounter (Signed)
Patient is one of Clay's patient's rescheduled with Dr. Jennelle Human for 04/29 but need a refill on Lamictal to be sent to [facility] on [address]

## 2018-09-02 ENCOUNTER — Other Ambulatory Visit: Payer: Self-pay

## 2018-09-02 MED ORDER — LAMOTRIGINE 25 MG PO TABS: 50.0000 mg | ORAL_TABLET | Freq: Every day | ORAL | 1 refills | Status: DC

## 2018-09-02 NOTE — Telephone Encounter (Signed)
rx submitted to [facility] [address] [place],

## 2018-09-07 ENCOUNTER — Ambulatory Visit: Payer: Self-pay | Admitting: Psychiatry

## 2018-09-17 ENCOUNTER — Ambulatory Visit (INDEPENDENT_AMBULATORY_CARE_PROVIDER_SITE_OTHER): Payer: PRIVATE HEALTH INSURANCE | Admitting: Mental Health

## 2018-09-17 ENCOUNTER — Other Ambulatory Visit: Payer: Self-pay

## 2018-09-17 DIAGNOSIS — F319 Bipolar disorder, unspecified: Secondary | ICD-10-CM | POA: Diagnosis not present
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
## 2018-10-01 ENCOUNTER — Other Ambulatory Visit: Payer: Self-pay

## 2018-10-01 ENCOUNTER — Ambulatory Visit (INDEPENDENT_AMBULATORY_CARE_PROVIDER_SITE_OTHER): Payer: PRIVATE HEALTH INSURANCE | Admitting: Mental Health

## 2018-10-01 DIAGNOSIS — F319 Bipolar disorder, unspecified: Secondary | ICD-10-CM | POA: Diagnosis not present

## 2018-10-01 NOTE — Progress Notes (Signed)
Psychotherapy Note  Name: Alan Norris Date: 10/01/2018 MRN: [id number] DOB: 20-Feb-1981 PCP: Dione Housekeeper, MD  Time spent: 45 minutes  Treatment:  Individual therapy  Mental Status Exam:   Appearance:   Casual     Behavior:  Appropriate  Motor:  Normal  Speech/Language:   Clear and Coherent  Affect:  Full Range  Mood:  euthymic  Thought process:  normal  Thought content:    WNL  Sensory/Perceptual disturbances:    WNL  Orientation:  x4  Attention:  Good  Concentration:  Good  Memory:  WNL  Fund of knowledge:   Good  Insight:    developing  Judgment:   Good  Impulse Control:  Good   Reported Symptoms:  Some sad days, mood swings hx (not at present due to med effectiveness)  Risk Assessment: Danger to Self:  No Self-injurious Behavior: No Danger to Others: No Duty to Warn:no Physical Aggression / Violence:No  Access to Firearms a concern: No  Gang Involvement:No  Patient / guardian was educated about steps to take if suicide or homicide risk level increases between visits: yes While future psychiatric events cannot be accurately predicted, the patient does not currently require acute inpatient psychiatric care and does not currently meet [facility] involuntary commitment criteria.  Medical History/Surgical History:   Past Medical History:  Diagnosis Date  . Depression     No past surgical history on file.  Medications: Current Outpatient Medications  Medication Sig Dispense Refill  . ARIPiprazole (ABILIFY) 10 MG tablet Take 10 mg by mouth daily.    . cyclobenzaprine (FLEXERIL) 5 MG tablet Take 1 tablet (5 mg total) by mouth every 8 (eight) hours as needed for muscle spasms. 12 tablet 0  . gabapentin (NEURONTIN) 100 MG capsule Take 100 mg by mouth 3 (three) times daily.    Marland Kitchen ketorolac (TORADOL) 10 MG tablet Take 1 tablet (10 mg total) by mouth every 8 (eight) hours. 15 tablet 0  . lamoTRIgine (LAMICTAL) 25 MG tablet Take 2 tablets (50 mg  total) by mouth daily. 2 tabs 60 tablet 1   No current facility-administered medications for this visit.     Allergies  Allergen Reactions  . Amoxicillin     unknown   Reason for Visit /Presenting Problem:  Patient engaged in teletherapy session.  Discussed progress since her last visit.  He stated that he has followed through with his homework assignment regarding the 7-day challenge.  He stated that he struggled at first but feels he is making headway on identifying negative self talk.  We further discussed some cognitive concepts, reframing.  He shared how he and his wife continue to have more contact and better communication.  The kids continue to live with her grandparents temporarily during the viral pandemic.  Continue to explore some work history, examples of when he loses motivation to keep jobs.  Went on a recent job interview via video call and is hopeful he will hear today if he obtains this position.  He shared how he has a distant history of being criticized by his parents and how he feels it affected him significantly over the years.  He stated that he was able to talk with his parents and how they have been more supportive, less critical.  How this is affected his marital relationship was explored.  He shared how he has a tendency to react in situations when feeling criticized by his wife.  He feels this is something he needs to  change within himself and is making some progress.  Encouraged him to continue cognitive strategies and coping skills discussed in today's session.  Interventions: CBT, problems solving, supportive therapy  Virtual Visit via Telephone Note I connected with patient by a video enabled telemedicine application or telephone, with their informed consent, and verified patient privacy and that I am speaking with the correct person using two identifiers. I discussed the limitations, risks, security and privacy concerns of performing psychotherapy and management service  by telephone and the availability of in person appointments. I also discussed with the patient that there may be a patient responsible charge related to this service. The patient expressed understanding and agreed to proceed. I discussed the treatment planning with the patient. The patient was provided an opportunity to ask questions and all were answered. The patient agreed with the plan and demonstrated an understanding of the instructions. The patient was advised to call  our office if  symptoms worsen or feel they are in a crisis state and need immediate contact.  Diagnoses:    ICD-10-CM   1. Bipolar I disorder (HCC) F31.9    Plan of Care:   1.  Patient to continue to engage in individual counseling 2-4 times a month or as needed. 2.  Patient to increase positive self talk, coping to improve job satisfaction / consistency.  3.   Patient to contact this office, go to the local ED or call 911 if a crisis or emergency develops between visits.  Waldron Sessionhristopher Ahilyn Nell, [facility]

## 2018-10-07 ENCOUNTER — Other Ambulatory Visit: Payer: Self-pay

## 2018-10-07 ENCOUNTER — Ambulatory Visit (INDEPENDENT_AMBULATORY_CARE_PROVIDER_SITE_OTHER): Payer: PRIVATE HEALTH INSURANCE | Admitting: Psychiatry

## 2018-10-07 ENCOUNTER — Encounter: Payer: Self-pay | Admitting: Psychiatry

## 2018-10-07 DIAGNOSIS — F319 Bipolar disorder, unspecified: Secondary | ICD-10-CM | POA: Diagnosis not present

## 2018-10-07 DIAGNOSIS — F411 Generalized anxiety disorder: Secondary | ICD-10-CM

## 2018-10-07 MED ORDER — LAMOTRIGINE 200 MG PO TABS: 200.0000 mg | ORAL_TABLET | Freq: Every day | ORAL | 1 refills | Status: DC

## 2018-10-07 NOTE — Progress Notes (Signed)
Alan BeachJoshua Ryan Kelly Norris [id number] [birth year] 38 y.o.  Virtual Visit via Telephone Note  I connected with pt by telephone and verified that I am speaking with the correct person using two identifiers.   I discussed the limitations, risks, security and privacy concerns of performing an evaluation and management service by telephone and the availability of in person appointments. I also discussed with the patient that there may be a patient responsible charge related to this service. The patient expressed understanding and agreed to proceed.  I discussed the assessment and treatment plan with the patient. The patient was provided an opportunity to ask questions and all were answered. The patient agreed with the plan and demonstrated an understanding of the instructions.   The patient was advised to call back or seek an in-person evaluation if the symptoms worsen or if the condition fails to improve as anticipated.  I provided 10 minutes of non-face-to-face time during this encounter. The call started at 135  and ended at 145. The patient was located at home and the provider was located office.   Subjective:   Patient ID:  Alan Norris is a 38 y.o. (DOB [birth year]) male.  Chief Complaint:  Chief Complaint  Patient presents with  . Follow-up    Medication Management    HPI Alan BeachJoshua Ryan Alan Norris Last seen in August.  No meds changed   Had moved to [place].  To help parents.  Had some depression while there and the lamotrigine was increased from 50 mg to 200 mg with good result.  Depression has appeared to resolved.  Doing real good.  Lost job with Covid.  Looking for job.  Meds working.  Patient reports stable mood and denies depressed or irritable moods since increase in lamotrigine.  Patient denies any recent difficulty with anxiety.  Patient denies difficulty with sleep initiation or maintenance. Denies appetite disturbance.  Patient reports that energy and motivation have been  good.  Patient denies any difficulty with concentration.  Patient denies any suicidal ideation.    Past Psychiatric Medication Trials:  CBZ, Abilify, lamotrigine  Review of Systems:  Review of Systems  Neurological: Negative for tremors and weakness.    Medications: I have reviewed the patient's current medications.  Current Outpatient Medications  Medication Sig Dispense Refill  . ARIPiprazole (ABILIFY) 10 MG tablet Take 10 mg by mouth daily.    Marland Kitchen. gabapentin (NEURONTIN) 100 MG capsule Take 100 mg by mouth 3 (three) times daily.    Marland Kitchen. lamoTRIgine (LAMICTAL) 200 MG tablet      No current facility-administered medications for this visit.     Medication Side Effects: None  Allergies:  Allergies  Allergen Reactions  . Amoxicillin     unknown  . Amoxicillin-Pot Clavulanate Other (See Comments)    Past Medical History:  Diagnosis Date  . Depression     History reviewed. No pertinent family history.  Social History   Socioeconomic History  . Marital status: Married    Spouse name: Not on file  . Number of children: Not on file  . Years of education: Not on file  . Highest education level: Not on file  Occupational History  . Not on file  Social Needs  . Financial resource strain: Not on file  . Food insecurity:    Worry: Not on file    Inability: Not on file  . Transportation needs:    Medical: Not on file    Non-medical: Not on file  Tobacco Use  .  Smoking status: Former Games developer  . Smokeless tobacco: Never Used  Substance and Sexual Activity  . Alcohol use: No  . Drug use: No  . Sexual activity: Not on file  Lifestyle  . Physical activity:    Days per week: Not on file    Minutes per session: Not on file  . Stress: Not on file  Relationships  . Social connections:    Talks on phone: Not on file    Gets together: Not on file    Attends religious service: Not on file    Active member of club or organization: Not on file    Attends meetings of clubs or  organizations: Not on file    Relationship status: Not on file  . Intimate partner violence:    Fear of current or ex partner: Not on file    Emotionally abused: Not on file    Physically abused: Not on file    Forced sexual activity: Not on file  Other Topics Concern  . Not on file  Social History Narrative  . Not on file    Past Medical History, Surgical history, Social history, and Family history were reviewed and updated as appropriate.   Please see review of systems for further details on the patient's review from today.   Objective:   Physical Exam:  There were no vitals taken for this visit.  Physical Exam Neurological:     Mental Status: He is alert and oriented to person, place, and time.     Cranial Nerves: No dysarthria.  Psychiatric:        Attention and Perception: Attention normal.        Mood and Affect: Mood normal.        Speech: Speech normal.        Behavior: Behavior is cooperative.        Thought Content: Thought content normal. Thought content is not paranoid or delusional. Thought content does not include homicidal or suicidal ideation. Thought content does not include homicidal or suicidal plan.        Cognition and Memory: Cognition and memory normal.        Judgment: Judgment normal.     Comments: satisfied     Lab Review:     Component Value Date/Time   NA 138 02/18/2015 1748   NA 141 12/25/2011 0940   K 4.0 02/18/2015 1748   K 4.4 12/25/2011 0940   CL 103 02/18/2015 1748   CL 102 12/25/2011 0940   CO2 27 02/18/2015 1748   CO2 29 12/25/2011 0940   GLUCOSE 135 (H) 02/18/2015 1748   GLUCOSE 105 (H) 12/25/2011 0940   BUN 15 02/18/2015 1748   BUN 11 12/25/2011 0940   CREATININE 0.93 02/18/2015 1748   CREATININE 0.92 12/25/2011 0940   CALCIUM 9.4 02/18/2015 1748   CALCIUM 9.5 12/25/2011 0940   PROT 7.8 02/18/2015 1748   PROT 8.1 12/25/2011 0940   ALBUMIN 4.2 02/18/2015 1748   ALBUMIN 3.9 12/25/2011 0940   AST 40 02/18/2015 1748   AST  46 (H) 12/25/2011 0940   ALT 55 02/18/2015 1748   ALT 92 (H) 12/25/2011 0940   ALKPHOS 58 02/18/2015 1748   ALKPHOS 73 12/25/2011 0940   BILITOT 0.3 02/18/2015 1748   BILITOT 0.6 12/25/2011 0940   GFRNONAA >60 02/18/2015 1748   GFRNONAA >60 12/25/2011 0940   GFRAA >60 02/18/2015 1748   GFRAA >60 12/25/2011 0940       Component Value Date/Time  WBC 11.8 (H) 02/18/2015 1748   RBC 5.22 02/18/2015 1748   HGB 15.0 02/18/2015 1748   HGB 15.2 12/25/2011 0940   HCT 45.0 02/18/2015 1748   HCT 45.4 12/25/2011 0940   PLT 381 02/18/2015 1748   PLT 373 12/25/2011 0940   MCV 86.1 02/18/2015 1748   MCV 88 12/25/2011 0940   MCH 28.7 02/18/2015 1748   MCHC 33.3 02/18/2015 1748   RDW 13.7 02/18/2015 1748   RDW 13.9 12/25/2011 0940   LYMPHSABS 3.0 12/25/2011 0940   MONOABS 1.2 (H) 12/25/2011 0940   EOSABS 0.3 12/25/2011 0940   BASOSABS 0.1 12/25/2011 0940    No results found for: POCLITH, LITHIUM   No results found for: PHENYTOIN, PHENOBARB, VALPROATE, CBMZ   .res Assessment: Plan:    Bipolar I disorder (HCC)  Generalized anxiety disorder   Nesta was first seen in our clinic in June 2019 diagnosed about 2016 2017 at [facility].  Has continued in counseling as needed.  Good response to the increase in lamotrigine while he was in [place] helping take care of his father's back injury.  Back in [place] now looking for a job.  Satisfied with the medications new  Discussed potential metabolic side effects associated with atypical antipsychotics, as well as potential risk for movement side effects. Advised pt to contact office if movement side effects occur.   Emphasized need for consistency with lamotrigine because of the rash risk.  He understands that.  No med changes indicated today.  Follow-up 6 months or sooner as needed.  Meredith Staggers MD, DFAPA  Please see After Visit Summary for patient specific instructions.  No future appointments.  No orders of  the defined types were placed in this encounter.     -------------------------------

## 2018-10-09 ENCOUNTER — Other Ambulatory Visit: Payer: Self-pay

## 2018-10-09 MED ORDER — GABAPENTIN 100 MG PO CAPS: 100.0000 mg | ORAL_CAPSULE | Freq: Three times a day (TID) | ORAL | 1 refills | Status: DC

## 2018-10-09 MED ORDER — LAMOTRIGINE 200 MG PO TABS: 200.0000 mg | ORAL_TABLET | Freq: Every day | ORAL | 1 refills | Status: DC

## 2018-10-09 MED ORDER — ARIPIPRAZOLE 10 MG PO TABS: 10.0000 mg | ORAL_TABLET | Freq: Every day | ORAL | 1 refills | Status: DC

## 2019-05-04 ENCOUNTER — Other Ambulatory Visit: Payer: Self-pay | Admitting: Psychiatry

## 2019-06-14 ENCOUNTER — Other Ambulatory Visit: Payer: Self-pay | Admitting: Psychiatry

## 2019-06-30 ENCOUNTER — Encounter: Payer: Self-pay | Admitting: Psychiatry

## 2019-06-30 ENCOUNTER — Ambulatory Visit (INDEPENDENT_AMBULATORY_CARE_PROVIDER_SITE_OTHER): Payer: 59 | Admitting: Psychiatry

## 2019-06-30 ENCOUNTER — Other Ambulatory Visit: Payer: Self-pay

## 2019-06-30 DIAGNOSIS — F319 Bipolar disorder, unspecified: Secondary | ICD-10-CM | POA: Diagnosis not present

## 2019-06-30 DIAGNOSIS — F411 Generalized anxiety disorder: Secondary | ICD-10-CM

## 2019-06-30 MED ORDER — ARIPIPRAZOLE 10 MG PO TABS: 10.0000 mg | ORAL_TABLET | Freq: Every day | ORAL | 1 refills | Status: DC

## 2019-06-30 MED ORDER — GABAPENTIN 100 MG PO CAPS: 100.0000 mg | ORAL_CAPSULE | Freq: Three times a day (TID) | ORAL | 1 refills | Status: DC

## 2019-06-30 MED ORDER — LAMOTRIGINE 200 MG PO TABS: 200.0000 mg | ORAL_TABLET | Freq: Every day | ORAL | 1 refills | Status: DC

## 2019-06-30 NOTE — Progress Notes (Signed)
Alan Norris [id number] [birth year] 39 y.o.   Subjective:   Patient ID:  Alan Norris is a 39 y.o. (DOB December 04, 1980) male.  Chief Complaint:  Chief Complaint  Patient presents with  . Follow-up    Medication Management  . Other    Bipolar 1    HPI Alan Norris presents today for follow-up of bipolar 1 disorder and generalized anxiety disorder.   Fu Last seen in October 07, 2018.  No meds changed   Had moved to [place].  To help parents.  Had some depression while there and the lamotrigine was increased from 50 mg to 200 mg with good result about October 2019. Moved back Feb 2020.   Doing real good. Mood very stable since last here.   Lost job with Covid.  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  Consistent with meds except middle dose gabapentin. Gabapentin Rx and helps anxiety.    Patient reports stable mood and denies depressed or irritable moods since increase in lamotrigine.  Patient denies any recent difficulty with anxiety.  Patient denies difficulty with sleep initiation or maintenance. Denies appetite disturbance.  Patient reports that energy and motivation have been good.  Patient denies any difficulty with concentration.  Patient denies any suicidal ideation.  Past Psychiatric Medication Trials:  CBZ, Abilify, lamotrigine, gabapentin  Review of Systems:  Review of Systems  Neurological: Negative for dizziness, tremors and weakness.    Medications: I have reviewed the patient's current medications.  Current Outpatient Medications  Medication Sig Dispense Refill  . ARIPiprazole (ABILIFY) 10 MG tablet Take 1 tablet (10 mg total) by mouth daily. 90 tablet 1  . gabapentin (NEURONTIN) 100 MG capsule Take 1 capsule (100 mg total) by mouth 3 (three) times daily. 270 capsule 1  . lamoTRIgine (LAMICTAL) 200 MG tablet Take 1 tablet (200 mg total) by mouth daily. 90 tablet 1  . Multiple Vitamins-Minerals (MULTIVITAMIN ADULT) CHEW Chew by  mouth.     No current facility-administered medications for this visit.    Medication Side Effects: None  Allergies:  Allergies  Allergen Reactions  . Amoxicillin     unknown  . Amoxicillin-Pot Clavulanate Other (See Comments)    Past Medical History:  Diagnosis Date  . Depression     History reviewed. No pertinent family history.  Social History   Socioeconomic History  . Marital status: Married    Spouse name: Not on file  . Number of children: Not on file  . Years of education: Not on file  . Highest education level: Not on file  Occupational History  . Not on file  Tobacco Use  . Smoking status: Former Games developer  . Smokeless tobacco: Never Used  Substance and Sexual Activity  . Alcohol use: No  . Drug use: No  . Sexual activity: Not on file  Other Topics Concern  . Not on file  Social History Narrative  . Not on file   Social Determinants of Health   Financial Resource Strain:   . Difficulty of Paying Living Expenses: Not on file  Food Insecurity:   . Worried About Programme researcher, broadcasting/film/video in the Last Year: Not on file  . Ran Out of Food in the Last Year: Not on file  Transportation Needs:   . Lack of Transportation (Medical): Not on file  . Lack of Transportation (Non-Medical): Not on file  Physical Activity:   . Days of Exercise per Week: Not on file  .  Minutes of Exercise per Session: Not on file  Stress:   . Feeling of Stress : Not on file  Social Connections:   . Frequency of Communication with Friends and Family: Not on file  . Frequency of Social Gatherings with Friends and Family: Not on file  . Attends Religious Services: Not on file  . Active Member of Clubs or Organizations: Not on file  . Attends Archivist Meetings: Not on file  . Marital Status: Not on file  Intimate Partner Violence:   . Fear of Current or Ex-Partner: Not on file  . Emotionally Abused: Not on file  . Physically Abused: Not on file  . Sexually Abused: Not on  file    Past Medical History, Surgical history, Social history, and Family history were reviewed and updated as appropriate.   Please see review of systems for further details on the patient's review from today.   Objective:   Physical Exam:  There were no vitals taken for this visit.  Physical Exam Constitutional:      General: He is not in acute distress.    Appearance: He is well-developed.  Musculoskeletal:        General: No deformity.  Neurological:     Mental Status: He is alert and oriented to person, place, and time.     Cranial Nerves: No dysarthria.     Coordination: Coordination normal.  Psychiatric:        Attention and Perception: Attention and perception normal. He does not perceive auditory or visual hallucinations.        Mood and Affect: Mood normal. Mood is not anxious or depressed. Affect is not labile, blunt, angry or inappropriate.        Speech: Speech normal.        Behavior: Behavior normal. Behavior is cooperative.        Thought Content: Thought content normal. Thought content is not paranoid or delusional. Thought content does not include homicidal or suicidal ideation. Thought content does not include homicidal or suicidal plan.        Cognition and Memory: Cognition and memory normal.        Judgment: Judgment normal.     Comments: satisfied     Lab Review:     Component Value Date/Time   NA 138 02/18/2015 1748   NA 141 12/25/2011 0940   K 4.0 02/18/2015 1748   K 4.4 12/25/2011 0940   CL 103 02/18/2015 1748   CL 102 12/25/2011 0940   CO2 27 02/18/2015 1748   CO2 29 12/25/2011 0940   GLUCOSE 135 (H) 02/18/2015 1748   GLUCOSE 105 (H) 12/25/2011 0940   BUN 15 02/18/2015 1748   BUN 11 12/25/2011 0940   CREATININE 0.93 02/18/2015 1748   CREATININE 0.92 12/25/2011 0940   CALCIUM 9.4 02/18/2015 1748   CALCIUM 9.5 12/25/2011 0940   PROT 7.8 02/18/2015 1748   PROT 8.1 12/25/2011 0940   ALBUMIN 4.2 02/18/2015 1748   ALBUMIN 3.9 12/25/2011  0940   AST 40 02/18/2015 1748   AST 46 (H) 12/25/2011 0940   ALT 55 02/18/2015 1748   ALT 92 (H) 12/25/2011 0940   ALKPHOS 58 02/18/2015 1748   ALKPHOS 73 12/25/2011 0940   BILITOT 0.3 02/18/2015 1748   BILITOT 0.6 12/25/2011 0940   GFRNONAA >60 02/18/2015 1748   GFRNONAA >60 12/25/2011 0940   GFRAA >60 02/18/2015 1748   GFRAA >60 12/25/2011 0940       Component Value Date/Time  WBC 11.8 (H) 02/18/2015 1748   RBC 5.22 02/18/2015 1748   HGB 15.0 02/18/2015 1748   HGB 15.2 12/25/2011 0940   HCT 45.0 02/18/2015 1748   HCT 45.4 12/25/2011 0940   PLT 381 02/18/2015 1748   PLT 373 12/25/2011 0940   MCV 86.1 02/18/2015 1748   MCV 88 12/25/2011 0940   MCH 28.7 02/18/2015 1748   MCHC 33.3 02/18/2015 1748   RDW 13.7 02/18/2015 1748   RDW 13.9 12/25/2011 0940   LYMPHSABS 3.0 12/25/2011 0940   MONOABS 1.2 (H) 12/25/2011 0940   EOSABS 0.3 12/25/2011 0940   BASOSABS 0.1 12/25/2011 0940    No results found for: POCLITH, LITHIUM   No results found for: PHENYTOIN, PHENOBARB, VALPROATE, CBMZ   .res Assessment: Plan:    Bipolar I disorder (HCC) - Plan: ARIPiprazole (ABILIFY) 10 MG tablet, lamoTRIgine (LAMICTAL) 200 MG tablet  Generalized anxiety disorder - Plan: gabapentin (NEURONTIN) 100 MG capsule   Eulalio was first seen in our clinic in June 2019 diagnosed about 2016- 2017 at [facility].  Has continued in counseling as needed.  Good response to the increase in lamotrigine while he was in [place] helping take care of his father's back injury.  .  Satisfied with the medications new  Discussed potential metabolic side effects associated with atypical antipsychotics, as well as potential risk for movement side effects. Advised pt to contact office if movement side effects occur.  Get labs from PCP per his request.  Disc need check glucose and lipids and monitor weight.  He plans PE soon  Emphasized need for consistency with lamotrigine because of the rash risk.  He  understands that.  No med changes indicated today.  Follow-up 6 months or sooner as needed.  Meredith Staggers MD, DFAPA  Please see After Visit Summary for patient specific instructions.  No future appointments.  No orders of the defined types were placed in this encounter.     -------------------------------

## 2019-10-23 ENCOUNTER — Other Ambulatory Visit: Payer: Self-pay | Admitting: Psychiatry

## 2019-10-23 DIAGNOSIS — F411 Generalized anxiety disorder: Secondary | ICD-10-CM

## 2019-12-19 ENCOUNTER — Emergency Department: Payer: BC Managed Care – PPO

## 2019-12-19 ENCOUNTER — Emergency Department
Admission: EM | Admit: 2019-12-19 | Discharge: 2019-12-19 | Disposition: A | Discharge status: Home / Self Care | Payer: BC Managed Care – PPO | Attending: Emergency Medicine | Admitting: Emergency Medicine

## 2019-12-19 ENCOUNTER — Other Ambulatory Visit: Payer: Self-pay

## 2019-12-19 DIAGNOSIS — Z20822 Contact with and (suspected) exposure to covid-19: Secondary | ICD-10-CM | POA: Insufficient documentation

## 2019-12-19 DIAGNOSIS — J209 Acute bronchitis, unspecified: Secondary | ICD-10-CM | POA: Diagnosis not present

## 2019-12-19 DIAGNOSIS — Z87891 Personal history of nicotine dependence: Secondary | ICD-10-CM | POA: Diagnosis not present

## 2019-12-19 DIAGNOSIS — R0602 Shortness of breath: Secondary | ICD-10-CM | POA: Diagnosis present

## 2019-12-19 DIAGNOSIS — J40 Bronchitis, not specified as acute or chronic: Secondary | ICD-10-CM

## 2019-12-19 LAB — HEPATIC FUNCTION PANEL
ALT: 56 U/L — ABNORMAL HIGH (ref 0–44)
AST: 40 U/L (ref 15–41)
Albumin: 3.9 g/dL (ref 3.5–5.0)
Alkaline Phosphatase: 52 U/L (ref 38–126)
Bilirubin, Direct: <0.1 mg/dL (ref 0.0–0.2)
Total Bilirubin: 0.5 mg/dL (ref 0.3–1.2)
Total Protein: 7.4 g/dL (ref 6.5–8.1)

## 2019-12-19 LAB — CBC
HCT: 41.5 % (ref 39.0–52.0)
Hemoglobin: 14.2 g/dL (ref 13.0–17.0)
MCH: 29.6 pg (ref 26.0–34.0)
MCHC: 34.2 g/dL (ref 30.0–36.0)
MCV: 86.6 fL (ref 80.0–100.0)
Platelets: 315 10*3/uL (ref 150–400)
RBC: 4.79 MIL/uL (ref 4.22–5.81)
RDW: 13.2 % (ref 11.5–15.5)
WBC: 8.2 10*3/uL (ref 4.0–10.5)
nRBC: 0 % (ref 0.0–0.2)

## 2019-12-19 LAB — SARS CORONAVIRUS 2 BY RT PCR (HOSPITAL ORDER, PERFORMED IN ~~LOC~~ HOSPITAL LAB): SARS Coronavirus 2: NEGATIVE

## 2019-12-19 LAB — BASIC METABOLIC PANEL
Anion gap: 7 (ref 5–15)
BUN: 9 mg/dL (ref 6–20)
CO2: 25 mmol/L (ref 22–32)
Calcium: 8.6 mg/dL — ABNORMAL LOW (ref 8.9–10.3)
Chloride: 104 mmol/L (ref 98–111)
Creatinine, Ser: 0.96 mg/dL (ref 0.61–1.24)
GFR calc Af Amer: >60 mL/min (ref >60)
GFR calc non Af Amer: >60 mL/min (ref >60)
Glucose, Bld: 160 mg/dL — ABNORMAL HIGH (ref 70–99)
Potassium: 3.7 mmol/L (ref 3.5–5.1)
Sodium: 136 mmol/L (ref 135–145)

## 2019-12-19 LAB — TROPONIN I (HIGH SENSITIVITY)
Troponin I (High Sensitivity): 6 ng/L (ref <18)
Troponin I (High Sensitivity): 6 ng/L (ref <18)

## 2019-12-19 LAB — GROUP A STREP BY PCR: Group A Strep by PCR: NOT DETECTED

## 2019-12-19 LAB — FIBRIN DERIVATIVES D-DIMER (ARMC ONLY): Fibrin derivatives D-dimer (ARMC): 417.6 ng/mL (FEU) (ref 0.00–499.00)

## 2019-12-19 MED ORDER — KETOROLAC TROMETHAMINE 30 MG/ML IJ SOLN
15.0000 mg | Freq: Once | INTRAMUSCULAR | Status: AC
  Administered 2019-12-19: 15 mg via INTRAVENOUS
  Filled 2019-12-19: qty 1

## 2019-12-19 MED ORDER — AZITHROMYCIN 250 MG PO TABS
ORAL_TABLET | ORAL | 0 refills | Status: AC
Start: 2019-12-19 — End: 2019-12-24

## 2019-12-19 MED ORDER — SODIUM CHLORIDE 0.9 % IV BOLUS
1000.0000 mL | Freq: Once | INTRAVENOUS | Status: AC
  Administered 2019-12-19: 1000 mL via INTRAVENOUS

## 2019-12-19 NOTE — Discharge Instructions (Addendum)
I suspect this most likely something viral in nature.  Could be an early bacterial infection I will start you on some azithromycin.  Take Flonase over-the-counter to help with your nasal congestion.  Get over-the-counter cough syrup such as guaifenesin to help with the cough.  Take Tylenol 1 g every 8 hours to help with any aches or fevers.  Also take ibuprofen 600 every 6 hours with food.  Return to the ER if you develop worsening shortness of breath or have any other concerns

## 2019-12-19 NOTE — ED Notes (Signed)
Pt pulled to triage 1 by this RN. Pt ambulatory with steady gait at this time. Repeat VS and trop obtained by this RN at this time. Pt c/o continued chest tightness and cough at this time.

## 2019-12-19 NOTE — ED Provider Notes (Signed)
[facility] Emergency Department Provider Note  ____________________________________________   First MD Initiated Contact with Patient 12/19/19 [phone]     (approximate)  I have reviewed the triage vital signs and the nursing notes.   HISTORY  Chief Complaint Shortness of Breath    HPI Chai Routh is a 39 y.o. male with depression who comes in for shortness of breath. Patient reports shortness of breath for last day. Shortness of breath is moderate, intermittent, nothing makes it better, worse with movement. States he has having a cough. Not eating as much. Reports a little bit of a scratchy throat as well. patient does report a lot of recent travel. States that he drove all the way to [place] from [place]. He also recently flew to [place]. He denies any unilateral leg swelling or history of blood clots. He has had his Covid vaccines.          Past Medical History:  Diagnosis Date  . Depression     There are no problems to display for this patient.   No past surgical history on file.  Prior to Admission medications   Medication Sig Start Date End Date Taking? Authorizing Provider  ARIPiprazole (ABILIFY) 10 MG tablet Take 1 tablet (10 mg total) by mouth daily. 06/30/19   Cottle, Steva Ready., MD  gabapentin (NEURONTIN) 100 MG capsule TAKE ONE CAPSULE BY MOUTH THREE TIMES A DAY 10/24/19   Cottle, Steva Ready., MD  lamoTRIgine (LAMICTAL) 200 MG tablet Take 1 tablet (200 mg total) by mouth daily. 06/30/19   Cottle, Steva Ready., MD  Multiple Vitamins-Minerals (MULTIVITAMIN ADULT) CHEW Chew by mouth.    [provider]    Allergies Amoxicillin and Amoxicillin-pot clavulanate  No family history on file.  Social History Social History   Tobacco Use  . Smoking status: Former Games developer  . Smokeless tobacco: Never Used  Substance Use Topics  . Alcohol use: No  . Drug use: No      Review of Systems Constitutional: No  fever/chills Eyes: No visual changes. ENT: Scratchy throat, Cardiovascular: No chest pain Respiratory: Positive for SOB, cough Gastrointestinal: No abdominal pain.  No nausea, no vomiting.  No diarrhea.  No constipation. Genitourinary: Negative for dysuria. Musculoskeletal: Negative for back pain. Skin: Negative for rash. Neurological: Negative for headaches, focal weakness or numbness. All other ROS negative ____________________________________________   PHYSICAL EXAM:  VITAL SIGNS: ED Triage Vitals  Enc Vitals Group     BP 12/19/19 0307 (!) 138/102     Pulse Rate 12/19/19 0307 (!) 127     Resp 12/19/19 0307 19     Temp 12/19/19 0307 98.8 F (37.1 C)     Temp Source 12/19/19 0307 Oral     SpO2 12/19/19 0307 96 %     Weight 12/19/19 0309 (!) 315 lb (142.9 kg)     Height 12/19/19 0309 6\' 2"  (1.88 m)     Head Circumference --      Peak Flow --      Pain Score 12/19/19 0319 0     Pain Loc --      Pain Edu? --      Excl. in GC? --     Constitutional: Alert and oriented. Well appearing and in no acute distress. Eyes: Conjunctivae are normal. EOMI. Head: Atraumatic. Nose: No congestion/rhinnorhea. Mouth/Throat: Mucous membranes are moist. OP clear Neck: No stridor. Trachea Midline. FROM Cardiovascular: Tachycardic, regular rhythm. Grossly normal heart sounds.  Good peripheral  circulation. Respiratory: Clear lungs, no increased work of breathing Gastrointestinal: Soft and nontender. No distention. No abdominal bruits.  Musculoskeletal: No lower extremity tenderness nor edema.  No joint effusions. Neurologic:  Normal speech and language. No gross focal neurologic deficits are appreciated.  Skin:  Skin is warm, dry and intact. No rash noted. Psychiatric: Mood and affect are normal. Speech and behavior are normal. GU: Deferred   ____________________________________________   LABS (all labs ordered are listed, but only abnormal results are displayed)  Labs Reviewed    BASIC METABOLIC PANEL - Abnormal; Notable for the following components:      Result Value   Glucose, Bld 160 (*)    Calcium 8.6 (*)    All other components within normal limits  HEPATIC FUNCTION PANEL - Abnormal; Notable for the following components:   ALT 56 (*)    All other components within normal limits  SARS CORONAVIRUS 2 BY RT PCR (HOSPITAL ORDER, PERFORMED IN [facility])  GROUP A STREP BY PCR  CBC  FIBRIN DERIVATIVES D-DIMER ([facility] ONLY)  TROPONIN I (HIGH SENSITIVITY)  TROPONIN I (HIGH SENSITIVITY)   ____________________________________________   ED ECG REPORT I, Concha Se, the attending physician, personally viewed and interpreted this ECG.  Sinus tachycardia rate of 120, no ST elevation, no T wave inversions, normal intervals ____________________________________________  RADIOLOGY Vela Prose, personally viewed and evaluated these images (plain radiographs) as part of my medical decision making, as well as reviewing the written report by the radiologist.  ED MD interpretation: No pneumonia  Official radiology report(s): DG Chest 2 View  Result Date: 12/19/2019 CLINICAL DATA:  Shortness of breath and cough. EXAM: CHEST - 2 VIEW COMPARISON:  December 25, 2011 FINDINGS: There is no evidence of acute infiltrate, pleural effusion or pneumothorax. The heart size and mediastinal contours are within normal limits. The visualized skeletal structures are unremarkable. IMPRESSION: No active cardiopulmonary disease. Electronically Signed   By: Aram Candela M.D.   On: 12/19/2019 04:02    ____________________________________________   PROCEDURES  Procedure(s) performed (including Critical Care):  .1-3 Lead EKG Interpretation Performed by: Concha Se, MD Authorized by: Concha Se, MD     Interpretation: abnormal     ECG rate:  90-120s   ECG rate assessment: tachycardic     Rhythm: sinus tachycardia     Ectopy: none     Conduction: normal        ____________________________________________   INITIAL IMPRESSION / ASSESSMENT AND PLAN / ED COURSE   David Rodriquez was evaluated in Emergency Department on 12/19/2019 for the symptoms described in the history of present illness. He was evaluated in the context of the global COVID-19 pandemic, which necessitated consideration that the patient might be at risk for infection with the SARS-CoV-2 virus that causes COVID-19. Institutional protocols and algorithms that pertain to the evaluation of patients at risk for COVID-19 are in a state of rapid change based on information released by regulatory bodies including the CDC and federal and state organizations. These policies and algorithms were followed during the patient's care in the ED.     Pt presents with SOB. Suspect this is most likely viral in nature. Patient is already had his Covid vaccine but will test for Covid. Patient was placed on the cardiac monitor and he, on talking to him his heart rates ranged from the 90s up to the 120s. Possibly secondary to dehydration will give 1 L of fluid.  PNA-will get xray  to evaluation Anemia-CBC to evaluate ACS- will get trops Arrhythmia-Will get EKG and keep on monitor.  COVID- will get testing per algorithm. PE-given significant traveling history and tachycardia will get D-dimer   Glucose slightly elevated at 160 but no anion gap elevation No evidence of anemia Cardiac markers are negative x2 D-dimer is negative Covid test is negative, Strep test negative  Reevaluated patient ambulates around the room with normal saturations.  Mild increased respiratory rate.  Heart rates have come down though. Lung sounds clear.  Patient states he is feeling better.  Suspect this could be viral in nature possibly an early bacterial pneumonia given the shortness of breath.  We will give a short course of azithromycin to cover for possible community-acquired pneumonia.  Discussed with patient that  if he is developing worsening symptoms that he should return to the ER for recheck of his oxygen levels.  Patient expressed understanding felt comfortable with this plan  I discussed the provisional nature of ED diagnosis, the treatment so far, the ongoing plan of care, follow up appointments and return precautions with the patient and any family or support people present. They expressed understanding and agreed with the plan, discharged home. ____________________________________________   FINAL CLINICAL IMPRESSION(S) / ED DIAGNOSES   Final diagnoses:  Bronchitis     MEDICATIONS GIVEN DURING THIS VISIT:  Medications  sodium chloride 0.9 % bolus 1,000 mL (0 mLs Intravenous Stopped 12/19/19 1057)  ketorolac (TORADOL) 30 MG/ML injection 15 mg (15 mg Intravenous Given 12/19/19 0855)     ED Discharge Orders         Ordered    azithromycin (ZITHROMAX Z-PAK) 250 MG tablet     Discontinue  Reprint     12/19/19 1101           Note:  This document was prepared using Dragon voice recognition software and may include unintentional dictation errors.   Concha Se, MD 12/19/19 1109

## 2019-12-19 NOTE — ED Notes (Signed)
Pt ambulated around the room O2 sat maintained between 97-100% on RA. Dr. Fuller Plan, MD made aware.

## 2019-12-19 NOTE — ED Triage Notes (Addendum)
Pt arrived via POV with c/o shortness of breath, states he was trying to sleep and noticed some drainage this morning.  Pt c/o congestion and cough. Pt states laying down in bed it was causing difficulty breathing.   Pt reports productive cough. Denies any chest pain  Pt has been traveling to [place] and [place] recently as well via airplane. Currently staying in hotel due to no air conditioning.

## 2019-12-19 NOTE — ED Notes (Signed)
Pt presents to the ED for flu-like symptoms. Pt is c/o SOB, productive cough, and feeling hot. Pt states that he has been feeling bad since last night. Pt states he took OTC medications with no relief. Pt placed on cardiac monitor and V/S WNL. Denies fevers and CP. Pt A&Ox4 and NAD

## 2019-12-21 ENCOUNTER — Other Ambulatory Visit: Payer: Self-pay

## 2019-12-21 DIAGNOSIS — J029 Acute pharyngitis, unspecified: Secondary | ICD-10-CM | POA: Diagnosis not present

## 2019-12-21 DIAGNOSIS — R05 Cough: Secondary | ICD-10-CM | POA: Insufficient documentation

## 2019-12-21 DIAGNOSIS — R0602 Shortness of breath: Secondary | ICD-10-CM | POA: Diagnosis not present

## 2019-12-21 DIAGNOSIS — Z5321 Procedure and treatment not carried out due to patient leaving prior to being seen by health care provider: Secondary | ICD-10-CM | POA: Insufficient documentation

## 2019-12-21 DIAGNOSIS — R0981 Nasal congestion: Secondary | ICD-10-CM | POA: Insufficient documentation

## 2019-12-22 ENCOUNTER — Telehealth: Payer: Self-pay | Admitting: Emergency Medicine

## 2019-12-22 ENCOUNTER — Emergency Department
Admission: EM | Admit: 2019-12-22 | Discharge: 2019-12-22 | Disposition: A | Discharge status: Home / Self Care | Payer: BC Managed Care – PPO | Attending: Emergency Medicine | Admitting: Emergency Medicine

## 2019-12-22 ENCOUNTER — Encounter: Payer: Self-pay | Admitting: Emergency Medicine

## 2019-12-22 ENCOUNTER — Other Ambulatory Visit: Payer: Self-pay

## 2019-12-22 ENCOUNTER — Emergency Department: Payer: BC Managed Care – PPO

## 2019-12-22 LAB — COMPREHENSIVE METABOLIC PANEL
ALT: 54 U/L — ABNORMAL HIGH (ref 0–44)
AST: 40 U/L (ref 15–41)
Albumin: 3.8 g/dL (ref 3.5–5.0)
Alkaline Phosphatase: 57 U/L (ref 38–126)
Anion gap: 6 (ref 5–15)
BUN: 10 mg/dL (ref 6–20)
CO2: 26 mmol/L (ref 22–32)
Calcium: 8.7 mg/dL — ABNORMAL LOW (ref 8.9–10.3)
Chloride: 107 mmol/L (ref 98–111)
Creatinine, Ser: 1 mg/dL (ref 0.61–1.24)
GFR calc Af Amer: >60 mL/min (ref >60)
GFR calc non Af Amer: >60 mL/min (ref >60)
Glucose, Bld: 142 mg/dL — ABNORMAL HIGH (ref 70–99)
Potassium: 3.5 mmol/L (ref 3.5–5.1)
Sodium: 139 mmol/L (ref 135–145)
Total Bilirubin: 0.5 mg/dL (ref 0.3–1.2)
Total Protein: 7.3 g/dL (ref 6.5–8.1)

## 2019-12-22 LAB — CBC WITH DIFFERENTIAL/PLATELET
Abs Immature Granulocytes: 0.04 10*3/uL (ref 0.00–0.07)
Basophils Absolute: 0.1 10*3/uL (ref 0.0–0.1)
Basophils Relative: 1 %
Eosinophils Absolute: 0.5 10*3/uL (ref 0.0–0.5)
Eosinophils Relative: 8 %
HCT: 40.7 % (ref 39.0–52.0)
Hemoglobin: 14.2 g/dL (ref 13.0–17.0)
Immature Granulocytes: 1 %
Lymphocytes Relative: 27 %
Lymphs Abs: 1.8 10*3/uL (ref 0.7–4.0)
MCH: 29.7 pg (ref 26.0–34.0)
MCHC: 34.9 g/dL (ref 30.0–36.0)
MCV: 85.1 fL (ref 80.0–100.0)
Monocytes Absolute: 1.5 10*3/uL — ABNORMAL HIGH (ref 0.1–1.0)
Monocytes Relative: 22 %
Neutro Abs: 2.8 10*3/uL (ref 1.7–7.7)
Neutrophils Relative %: 41 %
Platelets: 292 10*3/uL (ref 150–400)
RBC: 4.78 MIL/uL (ref 4.22–5.81)
RDW: 13.2 % (ref 11.5–15.5)
WBC: 6.7 10*3/uL (ref 4.0–10.5)
nRBC: 0 % (ref 0.0–0.2)

## 2019-12-22 LAB — TROPONIN I (HIGH SENSITIVITY): Troponin I (High Sensitivity): 8 ng/L (ref <18)

## 2019-12-22 NOTE — ED Triage Notes (Signed)
Patient ambulatory to triage with steady gait, without difficulty or distress noted; Pt c/o prod cough yellow sputum, SHOB, sore throat & congestion; seen here Saturday and dx with "possible pneumonia"; currently taking zpak; also reports syncopal episode today after coughing

## 2019-12-22 NOTE — Telephone Encounter (Signed)
Called patient due to lwot to inquire about condition and follow up plans.  Says he is a little better today.  Still coughing.  I encouraged him to call pcp and let them know that labs and cxr available for their review.  He says he will call them

## 2019-12-30 ENCOUNTER — Encounter: Payer: Self-pay | Admitting: Psychiatry

## 2019-12-30 ENCOUNTER — Ambulatory Visit (INDEPENDENT_AMBULATORY_CARE_PROVIDER_SITE_OTHER): Payer: BC Managed Care – PPO | Admitting: Psychiatry

## 2019-12-30 ENCOUNTER — Other Ambulatory Visit: Payer: Self-pay

## 2019-12-30 DIAGNOSIS — F319 Bipolar disorder, unspecified: Secondary | ICD-10-CM | POA: Diagnosis not present

## 2019-12-30 DIAGNOSIS — F411 Generalized anxiety disorder: Secondary | ICD-10-CM

## 2019-12-30 MED ORDER — GABAPENTIN 100 MG PO CAPS: 100.0000 mg | ORAL_CAPSULE | Freq: Three times a day (TID) | ORAL | 2 refills | Status: DC

## 2019-12-30 MED ORDER — ARIPIPRAZOLE 10 MG PO TABS: 10.0000 mg | ORAL_TABLET | Freq: Every day | ORAL | 2 refills | Status: DC

## 2019-12-30 MED ORDER — LAMOTRIGINE 200 MG PO TABS: 200.0000 mg | ORAL_TABLET | Freq: Every day | ORAL | 2 refills | Status: DC

## 2019-12-30 NOTE — Progress Notes (Signed)
Alan Norris [id number] [birth year] 39 y.o.   Subjective:   Patient ID:  Alan Norris is a 39 y.o. (DOB December 17, 1980) male.  Chief Complaint:  Chief Complaint  Patient presents with  . Follow-up    HPI Alan Norris presents today for follow-up of bipolar 1 disorder and generalized anxiety disorder.   Fu Last seen in January 2021.  No meds changed   Had moved to [place].  To help parents.  Had some depression while there and the lamotrigine was increased from 50 mg to 200 mg with good result about October 2019. Moved back Feb 2020.   12/30/2019 appointment with the following noted: I feel stable.  Doing well with work and school. Doing real good. Mood very stable since last here.   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  Consistent with meds except middle dose gabapentin. Gabapentin Rx and helps anxiety.   No SE.  Patient reports stable mood and denies depressed or irritable moods since increase in lamotrigine.  Patient denies any recent difficulty with anxiety.  Patient denies difficulty with sleep initiation or maintenance. Denies appetite disturbance.  Patient reports that energy and motivation have been good.  Patient denies any difficulty with concentration.  Patient denies any suicidal ideation.  Past Psychiatric Medication Trials:  CBZ, Abilify, lamotrigine, gabapentin  Review of Systems:  Review of Systems  Respiratory: Positive for cough. Negative for shortness of breath.   Neurological: Negative for dizziness, tremors and weakness.    Medications: I have reviewed the patient's current medications.  Current Outpatient Medications  Medication Sig Dispense Refill  . ARIPiprazole (ABILIFY) 10 MG tablet Take 1 tablet (10 mg total) by mouth daily. 90 tablet 2  . gabapentin (NEURONTIN) 100 MG capsule Take 1 capsule (100 mg total) by mouth 3 (three) times daily. 270 capsule 2  . lamoTRIgine (LAMICTAL) 200 MG tablet Take 1 tablet (200 mg  total) by mouth daily. 90 tablet 2  . Multiple Vitamins-Minerals (MULTIVITAMIN ADULT) CHEW Chew by mouth.     No current facility-administered medications for this visit.    Medication Side Effects: None  Allergies:  Allergies  Allergen Reactions  . Amoxicillin Anaphylaxis    unknown  . Amoxicillin-Pot Clavulanate Other (See Comments)    Past Medical History:  Diagnosis Date  . Depression     History reviewed. No pertinent family history.  Social History   Socioeconomic History  . Marital status: Divorced    Spouse name: Not on file  . Number of children: Not on file  . Years of education: Not on file  . Highest education level: Not on file  Occupational History  . Not on file  Tobacco Use  . Smoking status: Former Games developer  . Smokeless tobacco: Never Used  Substance and Sexual Activity  . Alcohol use: No  . Drug use: No  . Sexual activity: Not on file  Other Topics Concern  . Not on file  Social History Narrative  . Not on file   Social Determinants of Health   Financial Resource Strain:   . Difficulty of Paying Living Expenses:   Food Insecurity:   . Worried About Programme researcher, broadcasting/film/video in the Last Year:   . Barista in the Last Year:   Transportation Needs:   . Freight forwarder (Medical):   Marland Kitchen Lack of Transportation (Non-Medical):   Physical Activity:   . Days of Exercise per Week:   . Minutes of  Exercise per Session:   Stress:   . Feeling of Stress :   Social Connections:   . Frequency of Communication with Friends and Family:   . Frequency of Social Gatherings with Friends and Family:   . Attends Religious Services:   . Active Member of Clubs or Organizations:   . Attends Banker Meetings:   Marland Kitchen Marital Status:   Intimate Partner Violence:   . Fear of Current or Ex-Partner:   . Emotionally Abused:   Marland Kitchen Physically Abused:   . Sexually Abused:     Past Medical History, Surgical history, Social history, and Family history  were reviewed and updated as appropriate.   Please see review of systems for further details on the patient's review from today.   Objective:   Physical Exam:  There were no vitals taken for this visit.  Physical Exam Constitutional:      General: He is not in acute distress.    Appearance: He is well-developed. He is obese.  Musculoskeletal:        General: No deformity.  Neurological:     Mental Status: He is alert and oriented to person, place, and time.     Cranial Nerves: No dysarthria.     Coordination: Coordination normal.  Psychiatric:        Attention and Perception: Attention and perception normal. He does not perceive auditory or visual hallucinations.        Mood and Affect: Mood normal. Mood is not anxious or depressed. Affect is not labile, blunt, angry or inappropriate.        Speech: Speech normal.        Behavior: Behavior normal. Behavior is cooperative.        Thought Content: Thought content normal. Thought content is not paranoid or delusional. Thought content does not include homicidal or suicidal ideation. Thought content does not include homicidal or suicidal plan.        Cognition and Memory: Cognition and memory normal.        Judgment: Judgment normal.     Comments: Satisfied and good response.     Lab Review:     Component Value Date/Time   NA 139 12/22/2019 0011   NA 141 12/25/2011 0940   K 3.5 12/22/2019 0011   K 4.4 12/25/2011 0940   CL 107 12/22/2019 0011   CL 102 12/25/2011 0940   CO2 26 12/22/2019 0011   CO2 29 12/25/2011 0940   GLUCOSE 142 (H) 12/22/2019 0011   GLUCOSE 105 (H) 12/25/2011 0940   BUN 10 12/22/2019 0011   BUN 11 12/25/2011 0940   CREATININE 1.00 12/22/2019 0011   CREATININE 0.92 12/25/2011 0940   CALCIUM 8.7 (L) 12/22/2019 0011   CALCIUM 9.5 12/25/2011 0940   PROT 7.3 12/22/2019 0011   PROT 8.1 12/25/2011 0940   ALBUMIN 3.8 12/22/2019 0011   ALBUMIN 3.9 12/25/2011 0940   AST 40 12/22/2019 0011   AST 46 (H)  12/25/2011 0940   ALT 54 (H) 12/22/2019 0011   ALT 92 (H) 12/25/2011 0940   ALKPHOS 57 12/22/2019 0011   ALKPHOS 73 12/25/2011 0940   BILITOT 0.5 12/22/2019 0011   BILITOT 0.6 12/25/2011 0940   GFRNONAA >60 12/22/2019 0011   GFRNONAA >60 12/25/2011 0940   GFRAA >60 12/22/2019 0011   GFRAA >60 12/25/2011 0940       Component Value Date/Time   WBC 6.7 12/22/2019 0011   RBC 4.78 12/22/2019 0011   HGB 14.2 12/22/2019 0011  HGB 15.2 12/25/2011 0940   HCT 40.7 12/22/2019 0011   HCT 45.4 12/25/2011 0940   PLT 292 12/22/2019 0011   PLT 373 12/25/2011 0940   MCV 85.1 12/22/2019 0011   MCV 88 12/25/2011 0940   MCH 29.7 12/22/2019 0011   MCHC 34.9 12/22/2019 0011   RDW 13.2 12/22/2019 0011   RDW 13.9 12/25/2011 0940   LYMPHSABS 1.8 12/22/2019 0011   LYMPHSABS 3.0 12/25/2011 0940   MONOABS 1.5 (H) 12/22/2019 0011   MONOABS 1.2 (H) 12/25/2011 0940   EOSABS 0.5 12/22/2019 0011   EOSABS 0.3 12/25/2011 0940   BASOSABS 0.1 12/22/2019 0011   BASOSABS 0.1 12/25/2011 0940    No results found for: POCLITH, LITHIUM   No results found for: PHENYTOIN, PHENOBARB, VALPROATE, CBMZ   .res Assessment: Plan:    Bipolar I disorder (HCC) - Plan: ARIPiprazole (ABILIFY) 10 MG tablet, lamoTRIgine (LAMICTAL) 200 MG tablet  Generalized anxiety disorder - Plan: gabapentin (NEURONTIN) 100 MG capsule   Alan Norris was first seen in our clinic in June 2019 diagnosed about 2016- 2017 at [facility].  Has continued in counseling as needed.  Good response to the increase in lamotrigine while he was in [place] helping take care of his father's back injury.  .  Satisfied with the medications now  Discussed potential metabolic side effects associated with atypical antipsychotics, as well as potential risk for movement side effects. Advised pt to contact office if movement side effects occur.  Get labs from PCP per his request.  Disc need check glucose and lipids and monitor weight.  He plans PE  soon  Emphasized need for consistency with lamotrigine because of the rash risk.  He understands that.  No med changes indicated today.  Continue lamotrigine 200 Abilify 10 Gabapentin 100 TID  Follow-up 9 months bc of stability or sooner as needed.  Meredith Staggers MD, DFAPA  Please see After Visit Summary for patient specific instructions.  No future appointments.  No orders of the defined types were placed in this encounter.     -------------------------------

## 2020-05-03 ENCOUNTER — Other Ambulatory Visit: Payer: Self-pay

## 2020-05-03 ENCOUNTER — Telehealth: Payer: Self-pay | Admitting: Psychiatry

## 2020-05-03 DIAGNOSIS — F319 Bipolar disorder, unspecified: Secondary | ICD-10-CM

## 2020-05-03 DIAGNOSIS — F411 Generalized anxiety disorder: Secondary | ICD-10-CM

## 2020-05-03 MED ORDER — ARIPIPRAZOLE 10 MG PO TABS: 10.0000 mg | ORAL_TABLET | Freq: Every day | ORAL | 0 refills | Status: DC

## 2020-05-03 MED ORDER — LAMOTRIGINE 200 MG PO TABS
200.0000 mg | ORAL_TABLET | Freq: Every day | ORAL | 0 refills | Status: DC
Start: 2020-05-03 — End: 2020-09-28

## 2020-05-03 MED ORDER — GABAPENTIN 100 MG PO CAPS: 100.0000 mg | ORAL_CAPSULE | Freq: Three times a day (TID) | ORAL | 0 refills | Status: DC

## 2020-05-03 NOTE — Telephone Encounter (Signed)
4 day supply sent to [facility] [place]

## 2020-05-03 NOTE — Telephone Encounter (Signed)
Pt called to report he is out of town in [place] and left his meds at home. Asking for a 4 day Rx for Abilify 10 mg 1/d, Lamictal 100 mg 1/d and  Gabapentin 100 mg 3/d at [facility]  [address] [place] [address]  Phone  [phone]  Fax [phone]  Pt contact  [phone]

## 2020-09-28 ENCOUNTER — Encounter: Payer: Self-pay | Admitting: Psychiatry

## 2020-09-28 ENCOUNTER — Other Ambulatory Visit: Payer: Self-pay

## 2020-09-28 ENCOUNTER — Ambulatory Visit (INDEPENDENT_AMBULATORY_CARE_PROVIDER_SITE_OTHER): Payer: BC Managed Care – PPO | Admitting: Psychiatry

## 2020-09-28 DIAGNOSIS — F411 Generalized anxiety disorder: Secondary | ICD-10-CM | POA: Diagnosis not present

## 2020-09-28 DIAGNOSIS — F319 Bipolar disorder, unspecified: Secondary | ICD-10-CM

## 2020-09-28 MED ORDER — LAMOTRIGINE 200 MG PO TABS: 200.0000 mg | ORAL_TABLET | Freq: Every day | ORAL | 3 refills | Status: DC

## 2020-09-28 MED ORDER — ARIPIPRAZOLE 10 MG PO TABS: 10.0000 mg | ORAL_TABLET | Freq: Every day | ORAL | 3 refills | Status: DC

## 2020-09-28 NOTE — Progress Notes (Signed)
Alan Norris [id number] 15-Sep-1980 40 y.o.   Subjective:   Patient ID:  Alan Norris is a 40 y.o. (DOB Oct 08, 1980) male.  Chief Complaint:  Chief Complaint  Patient presents with  . Follow-up  . Bipolar I disorder (HCC)    HPI Alan Norris presents today for follow-up of bipolar 1 disorder and generalized anxiety disorder.   seen in January 2021.  No meds changed   Had moved to [place].  To help parents.  Had some depression while there and the lamotrigine was increased from 50 mg to 200 mg with good result about October 2019. Moved back Feb 2020.   12/30/2019 appointment with the following noted: I feel stable.  Doing well with work and school. Doing real good. Mood very stable since last here.   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  Consistent with meds except middle dose gabapentin. Gabapentin Rx and helps anxiety.   No SE. Plan: No med changes indicated today.  Continue lamotrigine 200 Abilify 10 Gabapentin 100 TID  09/28/2020 appointment with the following noted: Pretty good.  No mood swings.  Nothing new to report.  Busy with work and single father tghing.  Good 9 months.  Loves job and anxiety.   Anxiety a lot lower at new job.  Patient reports stable mood and denies depressed or irritable moods since increase in lamotrigine.  Patient denies any recent difficulty with anxiety.  Patient denies difficulty with sleep initiation or maintenance. Denies appetite disturbance.  Patient reports that energy and motivation have been good.  Patient denies any difficulty with concentration.  Patient denies any suicidal ideation.  Past Psychiatric Medication Trials:  CBZ, Abilify, lamotrigine, gabapentin used for anxiety   Review of Systems:  Review of Systems  Respiratory: Negative for cough and shortness of breath.   Neurological: Negative for dizziness, tremors and weakness.    Medications: I have reviewed the patient's current  medications.  Current Outpatient Medications  Medication Sig Dispense Refill  . gabapentin (NEURONTIN) 100 MG capsule Take 1 capsule (100 mg total) by mouth 3 (three) times daily. (Patient taking differently: Take 100 mg by mouth 2 (two) times daily.) 12 capsule 0  . Multiple Vitamins-Minerals (MULTIVITAMIN ADULT) CHEW Chew by mouth.    . ARIPiprazole (ABILIFY) 10 MG tablet Take 1 tablet (10 mg total) by mouth daily. 90 tablet 3  . lamoTRIgine (LAMICTAL) 200 MG tablet Take 1 tablet (200 mg total) by mouth daily. 90 tablet 3   No current facility-administered medications for this visit.    Medication Side Effects: None  Allergies:  Allergies  Allergen Reactions  . Amoxicillin Anaphylaxis    unknown  . Amoxicillin-Pot Clavulanate Other (See Comments)    Past Medical History:  Diagnosis Date  . Depression     History reviewed. No pertinent family history.  Social History   Socioeconomic History  . Marital status: Divorced    Spouse name: Not on file  . Number of children: Not on file  . Years of education: Not on file  . Highest education level: Not on file  Occupational History  . Not on file  Tobacco Use  . Smoking status: Former Games developer  . Smokeless tobacco: Never Used  Substance and Sexual Activity  . Alcohol use: No  . Drug use: No  . Sexual activity: Not on file  Other Topics Concern  . Not on file  Social History Narrative  . Not on file   Social Determinants  of Health   Financial Resource Strain: Not on file  Food Insecurity: Not on file  Transportation Needs: Not on file  Physical Activity: Not on file  Stress: Not on file  Social Connections: Not on file  Intimate Partner Violence: Not on file    Past Medical History, Surgical history, Social history, and Family history were reviewed and updated as appropriate.   Please see review of systems for further details on the patient's review from today.   Objective:   Physical Exam:  There were no  vitals taken for this visit.  Physical Exam Constitutional:      General: He is not in acute distress.    Appearance: He is well-developed. He is obese.  Musculoskeletal:        General: No deformity.  Neurological:     Mental Status: He is alert and oriented to person, place, and time.     Cranial Nerves: No dysarthria.     Coordination: Coordination normal.  Psychiatric:        Attention and Perception: Attention and perception normal. He does not perceive auditory or visual hallucinations.        Mood and Affect: Mood normal. Mood is not anxious or depressed. Affect is not labile, blunt, angry or inappropriate.        Speech: Speech normal.        Behavior: Behavior normal. Behavior is not slowed. Behavior is cooperative.        Thought Content: Thought content normal. Thought content is not paranoid or delusional. Thought content does not include homicidal or suicidal ideation. Thought content does not include homicidal or suicidal plan.        Cognition and Memory: Cognition and memory normal.        Judgment: Judgment normal.     Comments: Satisfied and good response.     Lab Review:     Component Value Date/Time   NA 139 12/22/2019 0011   NA 141 12/25/2011 0940   K 3.5 12/22/2019 0011   K 4.4 12/25/2011 0940   CL 107 12/22/2019 0011   CL 102 12/25/2011 0940   CO2 26 12/22/2019 0011   CO2 29 12/25/2011 0940   GLUCOSE 142 (H) 12/22/2019 0011   GLUCOSE 105 (H) 12/25/2011 0940   BUN 10 12/22/2019 0011   BUN 11 12/25/2011 0940   CREATININE 1.00 12/22/2019 0011   CREATININE 0.92 12/25/2011 0940   CALCIUM 8.7 (L) 12/22/2019 0011   CALCIUM 9.5 12/25/2011 0940   PROT 7.3 12/22/2019 0011   PROT 8.1 12/25/2011 0940   ALBUMIN 3.8 12/22/2019 0011   ALBUMIN 3.9 12/25/2011 0940   AST 40 12/22/2019 0011   AST 46 (H) 12/25/2011 0940   ALT 54 (H) 12/22/2019 0011   ALT 92 (H) 12/25/2011 0940   ALKPHOS 57 12/22/2019 0011   ALKPHOS 73 12/25/2011 0940   BILITOT 0.5 12/22/2019  0011   BILITOT 0.6 12/25/2011 0940   GFRNONAA >60 12/22/2019 0011   GFRNONAA >60 12/25/2011 0940   GFRAA >60 12/22/2019 0011   GFRAA >60 12/25/2011 0940       Component Value Date/Time   WBC 6.7 12/22/2019 0011   RBC 4.78 12/22/2019 0011   HGB 14.2 12/22/2019 0011   HGB 15.2 12/25/2011 0940   HCT 40.7 12/22/2019 0011   HCT 45.4 12/25/2011 0940   PLT 292 12/22/2019 0011   PLT 373 12/25/2011 0940   MCV 85.1 12/22/2019 0011   MCV 88 12/25/2011 0940   MCH  29.7 12/22/2019 0011   MCHC 34.9 12/22/2019 0011   RDW 13.2 12/22/2019 0011   RDW 13.9 12/25/2011 0940   LYMPHSABS 1.8 12/22/2019 0011   LYMPHSABS 3.0 12/25/2011 0940   MONOABS 1.5 (H) 12/22/2019 0011   MONOABS 1.2 (H) 12/25/2011 0940   EOSABS 0.5 12/22/2019 0011   EOSABS 0.3 12/25/2011 0940   BASOSABS 0.1 12/22/2019 0011   BASOSABS 0.1 12/25/2011 0940    No results found for: POCLITH, LITHIUM   No results found for: PHENYTOIN, PHENOBARB, VALPROATE, CBMZ   .res Assessment: Plan:    Bipolar I disorder (HCC) - Plan: ARIPiprazole (ABILIFY) 10 MG tablet, lamoTRIgine (LAMICTAL) 200 MG tablet  Generalized anxiety disorder   Favian was first seen in our clinic in June 2019 diagnosed about 2016- 2017 at [facility].  Has continued in counseling as needed.  Good response to the increase in lamotrigine while he was in [place] helping take care of his father's back injury.  .  Satisfied with the medications now  Discussed potential metabolic side effects associated with atypical antipsychotics, as well as potential risk for movement side effects. Advised pt to contact office if movement side effects occur.  PCP monitors labs  Emphasized need for consistency with lamotrigine because of the rash risk.  He understands that.  No med changes indicated today.  Continue lamotrigine 200 Abilify 10 Gabapentin 100 TID.  Option wean if wants on this.  Disc risk more anxiety.  He may try weaning it.  Follow-up 12 months bc  of stability or sooner as needed.  Meredith Staggers MD, DFAPA  Please see After Visit Summary for patient specific instructions.  No future appointments.  No orders of the defined types were placed in this encounter.     -------------------------------

## 2020-12-06 ENCOUNTER — Emergency Department: Payer: BC Managed Care – PPO

## 2020-12-06 ENCOUNTER — Encounter: Payer: Self-pay | Admitting: Emergency Medicine

## 2020-12-06 ENCOUNTER — Ambulatory Visit (INDEPENDENT_AMBULATORY_CARE_PROVIDER_SITE_OTHER)
Admission: EM | Admit: 2020-12-06 | Discharge: 2020-12-06 | Disposition: A | Discharge status: Home / Self Care | Payer: BC Managed Care – PPO | Source: Home / Self Care | Attending: Family Medicine | Admitting: Family Medicine

## 2020-12-06 ENCOUNTER — Emergency Department
Admission: EM | Admit: 2020-12-06 | Discharge: 2020-12-06 | Disposition: A | Discharge status: Home / Self Care | Payer: BC Managed Care – PPO | Attending: Emergency Medicine | Admitting: Emergency Medicine

## 2020-12-06 ENCOUNTER — Other Ambulatory Visit: Payer: Self-pay

## 2020-12-06 DIAGNOSIS — R42 Dizziness and giddiness: Secondary | ICD-10-CM | POA: Insufficient documentation

## 2020-12-06 DIAGNOSIS — R110 Nausea: Secondary | ICD-10-CM

## 2020-12-06 DIAGNOSIS — R9431 Abnormal electrocardiogram [ECG] [EKG]: Secondary | ICD-10-CM

## 2020-12-06 DIAGNOSIS — R079 Chest pain, unspecified: Secondary | ICD-10-CM | POA: Insufficient documentation

## 2020-12-06 DIAGNOSIS — Z87891 Personal history of nicotine dependence: Secondary | ICD-10-CM | POA: Insufficient documentation

## 2020-12-06 LAB — CBC
HCT: 46.9 % (ref 39.0–52.0)
Hemoglobin: 15.8 g/dL (ref 13.0–17.0)
MCH: 29.6 pg (ref 26.0–34.0)
MCHC: 33.7 g/dL (ref 30.0–36.0)
MCV: 87.8 fL (ref 80.0–100.0)
Platelets: 444 10*3/uL — ABNORMAL HIGH (ref 150–400)
RBC: 5.34 MIL/uL (ref 4.22–5.81)
RDW: 13.2 % (ref 11.5–15.5)
WBC: 11.5 10*3/uL — ABNORMAL HIGH (ref 4.0–10.5)
nRBC: 0 % (ref 0.0–0.2)

## 2020-12-06 LAB — TROPONIN I (HIGH SENSITIVITY)
Troponin I (High Sensitivity): 6 ng/L (ref <18)
Troponin I (High Sensitivity): 6 ng/L (ref <18)

## 2020-12-06 LAB — BASIC METABOLIC PANEL
Anion gap: 5 (ref 5–15)
BUN: 16 mg/dL (ref 6–20)
CO2: 28 mmol/L (ref 22–32)
Calcium: 9 mg/dL (ref 8.9–10.3)
Chloride: 103 mmol/L (ref 98–111)
Creatinine, Ser: 0.86 mg/dL (ref 0.61–1.24)
GFR, Estimated: >60 mL/min (ref >60)
Glucose, Bld: 113 mg/dL — ABNORMAL HIGH (ref 70–99)
Potassium: 4.2 mmol/L (ref 3.5–5.1)
Sodium: 136 mmol/L (ref 135–145)

## 2020-12-06 LAB — GLUCOSE, CAPILLARY: Glucose-Capillary: 102 mg/dL — ABNORMAL HIGH (ref 70–99)

## 2020-12-06 MED ORDER — ASPIRIN 81 MG PO CHEW
324.0000 mg | CHEWABLE_TABLET | Freq: Once | ORAL | Status: AC
  Administered 2020-12-06: 324 mg via ORAL

## 2020-12-06 NOTE — ED Triage Notes (Signed)
Pt arrives via ems from urgent care for c/o CP and dizziness started this morning. ekg charges noted at urgent care. Rated pain 8/10 pain. 325 asa given at urgent care. Pain improved to a 5/10 pain at this time described as a dull pressure sitting on chest. NAD noted at this time.   18g left ac placed by ems BP-150/85 HR- 65 RR- 16 CBG 102

## 2020-12-06 NOTE — ED Notes (Addendum)
Pt resting in bed in NAD. Provider has seen pt. Provided ice water per request.   Pt states that chest pressure, nausea and dizziness that he felt this morning have resolved. He states that symptoms began to resolve after took 325mg  ASA at urgent care.

## 2020-12-06 NOTE — ED Notes (Signed)
E sig pad not working for pt to sign discharge consent. Attempted to print for pt to sign paper copy, unable to do so. Pt gives verbal consent to discharge.

## 2020-12-06 NOTE — ED Notes (Signed)
Patient is being discharged from the Urgent Care and sent to the Emergency Department via EMS . Per Athena Masse, PA, patient is in need of higher level of care due to chest pain and abnormal EKG. Patient is aware and verbalizes understanding of plan of care.  Vitals:   12/06/20 0948  BP: (!) 139/98  Pulse: 78  Resp: 18  Temp: 98.3 F (36.8 C)  SpO2: 100%

## 2020-12-06 NOTE — Discharge Instructions (Signed)

## 2020-12-06 NOTE — ED Provider Notes (Signed)
[facility]    CSN: [id number] Arrival date & time: 12/06/20  7371      History   Chief Complaint Chief Complaint  Patient presents with   Dizziness   Chest Pain    HPI Alan Norris is a 40 y.o. male presenting with sudden onset of central chest tightness, dizziness, and nausea about 1.5 to 2 hours ago.  Patient says symptoms have not improved or worsened since onset.  He says that he was dropping his child off at school when his symptoms suddenly started.  He drove himself to the urgent care today. He denies any associated headaches, syncope or presyncope, radiation of the pain, shortness of breath, palpitations, abdominal pain, vomiting.  No recent illness.  Denies any fever, cough or congestion.  Patient has not taken anything for symptoms.  No similar symptoms in the past. Past medically significant for bipolar 1 disorder and obesity, but he denies any history of hypertension, hyperlipidemia, diabetes, etc.  Patient has no other complaints.  HPI  Past Medical History:  Diagnosis Date   Depression     There are no problems to display for this patient.   History reviewed. No pertinent surgical history.     Home Medications    Prior to Admission medications   Medication Sig Start Date End Date Taking? Authorizing Provider  ARIPiprazole (ABILIFY) 10 MG tablet Take 1 tablet (10 mg total) by mouth daily. 09/28/20  Yes Cottle, Steva Ready., MD  gabapentin (NEURONTIN) 100 MG capsule Take 1 capsule (100 mg total) by mouth 3 (three) times daily. Patient taking differently: Take 100 mg by mouth 2 (two) times daily. 05/03/20  Yes Cottle, Steva Ready., MD  lamoTRIgine (LAMICTAL) 200 MG tablet Take 1 tablet (200 mg total) by mouth daily. 09/28/20  Yes Cottle, Steva Ready., MD  Multiple Vitamins-Minerals (MULTIVITAMIN ADULT) CHEW Chew by mouth.   Yes [provider]    Family History Family History  Problem Relation Age of Onset   Hypertension Father      Social History Social History   Tobacco Use   Smoking status: Former    Pack years: 0.00   Smokeless tobacco: Never  Vaping Use   Vaping Use: Never used  Substance Use Topics   Alcohol use: No   Drug use: No     Allergies   Amoxicillin and Amoxicillin-pot clavulanate   Review of Systems Review of Systems  Constitutional:  Positive for diaphoresis. Negative for fever.  HENT:  Negative for congestion.   Eyes:  Negative for visual disturbance.  Respiratory:  Positive for chest tightness. Negative for cough and shortness of breath.   Cardiovascular:  Positive for chest pain. Negative for palpitations and leg swelling.  Gastrointestinal:  Positive for nausea. Negative for abdominal pain and vomiting.  Neurological:  Positive for dizziness. Negative for weakness and headaches.    Physical Exam Triage Vital Signs ED Triage Vitals  Enc Vitals Group     BP 12/06/20 0948 (!) 139/98     Pulse Rate 12/06/20 0948 78     Resp 12/06/20 0948 18     Temp 12/06/20 0948 98.3 F (36.8 C)     Temp Source 12/06/20 0948 Oral     SpO2 12/06/20 0948 100 %     Weight 12/06/20 0945 (!) 310 lb (140.6 kg)     Height 12/06/20 0945 6\' 2"  (1.88 m)     Head Circumference --      Peak Flow --  Pain Score 12/06/20 0945 8     Pain Loc --      Pain Edu? --      Excl. in GC? --    No data found.  Updated Vital Signs BP (!) 139/98 (BP Location: Left Arm)   Pulse 78   Temp 98.3 F (36.8 C) (Oral)   Resp 18   Ht 6\' 2"  (1.88 m)   Wt (!) 310 lb (140.6 kg)   SpO2 100%   BMI 39.80 kg/m       Physical Exam Vitals and nursing note reviewed.  Constitutional:      General: He is not in acute distress.    Appearance: Normal appearance. He is well-developed. He is obese. He is diaphoretic (mildly). He is not ill-appearing.  HENT:     Head: Normocephalic and atraumatic.  Eyes:     General: No scleral icterus.    Conjunctiva/sclera: Conjunctivae normal.  Cardiovascular:     Rate and  Rhythm: Normal rate and regular rhythm.     Heart sounds: Normal heart sounds.  Pulmonary:     Effort: Pulmonary effort is normal. No respiratory distress.     Breath sounds: Normal breath sounds.  Abdominal:     Palpations: Abdomen is soft.     Tenderness: There is no abdominal tenderness.  Musculoskeletal:     Cervical back: Neck supple.     Right lower leg: No edema.     Left lower leg: No edema.  Skin:    General: Skin is warm.  Neurological:     General: No focal deficit present.     Mental Status: He is alert and oriented to person, place, and time. Mental status is at baseline.     Motor: No weakness.     Gait: Gait normal.  Psychiatric:        Mood and Affect: Mood normal.        Behavior: Behavior normal.        Thought Content: Thought content normal.     UC Treatments / Results  Labs (all labs ordered are listed, but only abnormal results are displayed) Labs Reviewed  GLUCOSE, CAPILLARY - Abnormal; Notable for the following components:      Result Value   Glucose-Capillary 102 (*)    All other components within normal limits  CBG MONITORING, ED    EKG   Radiology No results found.  Procedures ED EKG  Date/Time: 12/06/2020 10:18 AM Performed by: 12/08/2020, PA-C Authorized by: Shirlee Latch, DO   ECG reviewed by ED Physician in the absence of a cardiologist: yes   Previous ECG:    Previous ECG:  Compared to current   Similarity:  Changes noted Interpretation:    Interpretation: abnormal   Rate:    ECG rate:  74   ECG rate assessment: normal   Rhythm:    Rhythm: sinus rhythm   Ectopy:    Ectopy: none   QRS:    QRS axis:  Normal   QRS intervals:  Normal   QRS conduction: normal   ST segments:    ST segments:  Elevation   Details:  Mild ST elevation II T waves:    T waves: normal   Comments:     Normal sinus rhythm. Mild ST elevation II (including critical care time)  Medications Ordered in UC Medications  aspirin chewable tablet  324 mg (324 mg Oral Given 12/06/20 1014)    Initial Impression / Assessment and Plan /  UC Course  I have reviewed the triage vital signs and the nursing notes.  Pertinent labs & imaging results that were available during my care of the patient were reviewed by me and considered in my medical decision making (see chart for details).  39 year old male presenting for (acute emergency) sudden onset chest tightness over the past hour and 1/2 to 2 hours.  Also admits to nausea and dizziness.  Blood pressure is 139/98.  Other vital signs are normal and stable.  He is mildly diaphoretic.  EKG today shows mild ST elevation in lead II, otherwise normal.  Compared to previous EKG which did not show this abnormality.  Exam is otherwise benign today.  Heart regular rate and rhythm and chest is clear to auscultation.  Patient given 324 mg aspirin and IV started.  EMS called.  Advised patient he needs further immediate work-up in the emergency department to assess for ACS and other potential causes for symptoms.  High suspicion for acute MI.  Patient agrees to plan to go via EMS.  Report given to EMS and first responders.  Final Clinical Impressions(s) / UC Diagnoses   Final diagnoses:  Chest pain, unspecified type  Dizziness  Nausea without vomiting  Nonspecific abnormal electrocardiogram (ECG) (EKG)     Discharge Instructions      You have been advised to follow up immediately in the emergency department for concerning signs.symptoms. If you declined EMS transport, please have a family member take you directly to the ED at this time. Do not delay. Based on concerns about condition, if you do not follow up in th e ED, you may risk poor outcomes including worsening of condition, delayed treatment and potentially life threatening issues. If you have declined to go to the ED at this time, you should call your PCP immediately to set up a follow up appointment.  Go to ED for red flag symptoms, including; fevers  you cannot reduce with Tylenol/Motrin, severe headaches, vision changes, numbness/weakness in part of the body, lethargy, confusion, intractable vomiting, severe dehydration, chest pain, breathing difficulty, severe persistent abdominal or pelvic pain, signs of severe infection (increased redness, swelling of an area), feeling faint or passing out, dizziness, etc. You should especially go to the ED for sudden acute worsening of condition if you do not elect to go at this time.      ED Prescriptions   None    PDMP not reviewed this encounter.   Shirlee Latch, PA-C 12/06/20 1156

## 2020-12-06 NOTE — ED Provider Notes (Signed)
[facility] Emergency Department Provider Note  Time seen: 12:17 PM  I have reviewed the triage vital signs and the nursing notes.   HISTORY  Chief Complaint Chest Pain   HPI Alan Norris is a 40 y.o. male with a past medical history depression presents emergency department for chest discomfort and dizziness.  According to the patient around 845 this morning after dropping his daughter off at soccer he began feeling some discomfort in his chest and feeling very dizzy.  Patient went to urgent care for evaluation where he was found to have an abnormal EKG and was sent to the emergency department for further evaluation.  Patient states he received an aspirin prior to transfer over here and states the chest pain and dizziness have since resolved.  Denies any symptoms currently.  No history of heart disease or chest pain previously.  States he was little nauseous earlier with the dizziness but denies any vomiting.  No shortness of breath or diaphoresis.   Past Medical History:  Diagnosis Date   Depression     There are no problems to display for this patient.   History reviewed. No pertinent surgical history.  Prior to Admission medications   Medication Sig Start Date End Date Taking? Authorizing Provider  ARIPiprazole (ABILIFY) 10 MG tablet Take 1 tablet (10 mg total) by mouth daily. 09/28/20   Cottle, Steva Ready., MD  gabapentin (NEURONTIN) 100 MG capsule Take 1 capsule (100 mg total) by mouth 3 (three) times daily. Patient taking differently: Take 100 mg by mouth 2 (two) times daily. 05/03/20   Cottle, Steva Ready., MD  lamoTRIgine (LAMICTAL) 200 MG tablet Take 1 tablet (200 mg total) by mouth daily. 09/28/20   Cottle, Steva Ready., MD  Multiple Vitamins-Minerals (MULTIVITAMIN ADULT) CHEW Chew by mouth.    [provider]    Allergies  Allergen Reactions   Amoxicillin Anaphylaxis    unknown   Amoxicillin-Pot Clavulanate Other (See Comments)     Family History  Problem Relation Age of Onset   Hypertension Father     Social History Social History   Tobacco Use   Smoking status: Former    Pack years: 0.00   Smokeless tobacco: Never  Vaping Use   Vaping Use: Never used  Substance Use Topics   Alcohol use: No   Drug use: No    Review of Systems Constitutional: Negative for fever.  Positive for dizziness, now resolved Cardiovascular: Positive for chest pain/discomfort, now resolved Respiratory: Negative for shortness of breath. Gastrointestinal: Negative for abdominal pain, vomiting  Musculoskeletal: Negative for musculoskeletal complaints Neurological: Negative for headache All other ROS negative  ____________________________________________   PHYSICAL EXAM:  VITAL SIGNS: ED Triage Vitals  Enc Vitals Group     BP 12/06/20 1107 (!) 136/92     Pulse Rate 12/06/20 1107 85     Resp 12/06/20 1107 18     Temp 12/06/20 1107 98.5 F (36.9 C)     Temp Source 12/06/20 1107 Oral     SpO2 12/06/20 1107 99 %     Weight 12/06/20 1108 (!) 310 lb (140.6 kg)     Height 12/06/20 1108 6\' 2"  (1.88 m)     Head Circumference --      Peak Flow --      Pain Score 12/06/20 1108 5     Pain Loc --      Pain Edu? --      Excl. in GC? --  Constitutional: Alert and oriented. Well appearing and in no distress. Eyes: Normal exam ENT      Head: Normocephalic and atraumatic.      Mouth/Throat: Mucous membranes are moist. Cardiovascular: Normal rate, regular rhythm.  Respiratory: Normal respiratory effort without tachypnea nor retractions. Breath sounds are clear  Gastrointestinal: Soft and nontender. No distention.  Musculoskeletal: Nontender with normal range of motion in all extremities.  Neurologic:  Normal speech and language. No gross focal neurologic deficits  Skin:  Skin is warm, dry and intact.  Psychiatric: Mood and affect are normal.   ____________________________________________    EKG  EKG viewed and  interpreted by myself shows a normal sinus rhythm 81 bpm with a narrow QRS, normal axis, normal intervals, no concerning ST changes.  Unchanged from last EKG 12/22/2019  ____________________________________________    RADIOLOGY  Chest x-ray is negative for acute abnormality  ____________________________________________   INITIAL IMPRESSION / ASSESSMENT AND PLAN / ED COURSE  Pertinent labs & imaging results that were available during my care of the patient were reviewed by me and considered in my medical decision making (see chart for details).   Patient presents emergency department for chest pain and dizziness starting around 845 this morning.  Patient states the dizziness and chest pain have since resolved denies any symptoms currently.  Patient was seen in urgent care and sent to the emergency department due to his chest pain and an abnormal EKG.  I reviewed the patient's urgent care EKG which was read as likely early repolarization but otherwise appeared well, repeat EKG in the emergency department here appears well both of which appear largely unchanged from prior EKG 7/21.  Patient is initial lab work including initial troponin are negative.  We will repeat a troponin at the 2-hour mark.  If the repeat troponin remains negative anticipate likely discharge home with PCP follow-up.  Patient agreeable to plan of care and remained symptom-free.  Chest x-ray is negative for acute abnormality.  Lab work is reassuring.  Negative second troponin.  We will discharge patient home with PCP follow-up.  [name] was evaluated in Emergency Department on 12/06/2020 for the symptoms described in the history of present illness. He was evaluated in the context of the global COVID-19 pandemic, which necessitated consideration that the patient might be at risk for infection with the SARS-CoV-2 virus that causes COVID-19. Institutional protocols and algorithms that pertain to the evaluation of  patients at risk for COVID-19 are in a state of rapid change based on information released by regulatory bodies including the CDC and federal and state organizations. These policies and algorithms were followed during the patient's care in the ED.  ____________________________________________   FINAL CLINICAL IMPRESSION(S) / ED DIAGNOSES  Dizziness Chest pain   Minna Antis, MD 12/06/20 1448

## 2020-12-06 NOTE — ED Triage Notes (Signed)
Pt c/o dizziness, nausea and chest tightness. Started suddenly this morning. Denies shortness of breath.

## 2021-09-17 ENCOUNTER — Institutional Professional Consult (permissible substitution): Payer: BLUE CROSS/BLUE SHIELD | Admitting: Primary Care

## 2021-09-25 ENCOUNTER — Ambulatory Visit
Admission: RE | Admit: 2021-09-25 | Discharge: 2021-09-25 | Disposition: A | Discharge status: Home / Self Care | Payer: BC Managed Care – PPO | Source: Ambulatory Visit | Attending: Emergency Medicine | Admitting: Emergency Medicine

## 2021-09-25 VITALS — BP 146/90 | HR 100 | Temp 98.6°F | Resp 20

## 2021-09-25 DIAGNOSIS — J069 Acute upper respiratory infection, unspecified: Secondary | ICD-10-CM | POA: Diagnosis not present

## 2021-09-25 MED ORDER — BENZONATATE 100 MG PO CAPS: 100.0000 mg | ORAL_CAPSULE | Freq: Three times a day (TID) | ORAL | 0 refills | Status: AC | PRN

## 2021-09-25 NOTE — Discharge Instructions (Addendum)
Your COVID, Flu, and RSV tests are pending.  You should self quarantine until the test results are back.   ? ?Take the [facility] as prescribed for cough.  Take plain Mucinex as needed for congestion.  Take Tylenol or ibuprofen as needed for fever or discomfort.  Rest and keep yourself hydrated.   ? ?Follow-up with your primary care provider if your symptoms are not improving.   ? ? ?

## 2021-09-25 NOTE — ED Triage Notes (Signed)
Pt here with productive cough, chills, and body aches x 3 days.  ?

## 2021-09-25 NOTE — ED Provider Notes (Signed)
?[facility] ? ? ? ?CSN: [id number] ?Arrival date & time: 09/25/21  8563 ? ? ?  ? ?History   ?Chief Complaint ?Chief Complaint  ?Patient presents with  ? Generalized Body Aches  ?  Body Aches, and Chills. - Entered by patient  ? Cough  ? Chills  ? ? ?HPI ?Alan Norris is a 41 y.o. male.  Patient presents with chills, body aches, cough x3 days.  He denies fever, rash, ear pain, sore throat, shortness of breath, chest pain, vomiting, diarrhea, or other symptoms.  No OTC medications taken today.  His medical history includes depression.  ? ?The history is provided by the patient and medical records.  ? ?Past Medical History:  ?Diagnosis Date  ? Depression   ? ? ?There are no problems to display for this patient. ? ? ?History reviewed. No pertinent surgical history. ? ? ? ? ?Home Medications   ? ?Prior to Admission medications   ?Medication Sig Start Date End Date Taking? Authorizing Provider  ?benzonatate (TESSALON) 100 MG capsule Take 1 capsule (100 mg total) by mouth 3 (three) times daily as needed for cough. 09/25/21  Yes Mickie Bail, NP  ?ARIPiprazole (ABILIFY) 10 MG tablet Take 1 tablet (10 mg total) by mouth daily. 09/28/20   Cottle, Steva Ready., MD  ?gabapentin (NEURONTIN) 100 MG capsule Take 1 capsule (100 mg total) by mouth 3 (three) times daily. ?Patient taking differently: Take 100 mg by mouth 2 (two) times daily. 05/03/20   Cottle, Steva Ready., MD  ?lamoTRIgine (LAMICTAL) 200 MG tablet Take 1 tablet (200 mg total) by mouth daily. 09/28/20   Cottle, Steva Ready., MD  ?Multiple Vitamins-Minerals (MULTIVITAMIN ADULT) CHEW Chew by mouth.    [provider]  ? ? ?Family History ?Family History  ?Problem Relation Age of Onset  ? Hypertension Father   ? ? ?Social History ?Social History  ? ?Tobacco Use  ? Smoking status: Former  ? Smokeless tobacco: Never  ?Vaping Use  ? Vaping Use: Never used  ?Substance Use Topics  ? Alcohol use: No  ? Drug use: No  ? ? ? ?Allergies   ?Amoxicillin and  Amoxicillin-pot clavulanate ? ? ?Review of Systems ?Review of Systems  ?Constitutional:  Positive for chills. Negative for fever.  ?HENT:  Negative for ear pain and sore throat.   ?Respiratory:  Positive for cough. Negative for shortness of breath.   ?Cardiovascular:  Negative for chest pain and palpitations.  ?Gastrointestinal:  Negative for diarrhea and vomiting.  ?Skin:  Negative for color change and rash.  ?All other systems reviewed and are negative. ? ? ?Physical Exam ?Triage Vital Signs ?ED Triage Vitals  ?Enc Vitals Group  ?   BP   ?   Pulse   ?   Resp   ?   Temp   ?   Temp src   ?   SpO2   ?   Weight   ?   Height   ?   Head Circumference   ?   Peak Flow   ?   Pain Score   ?   Pain Loc   ?   Pain Edu?   ?   Excl. in GC?   ? ?No data found. ? ?Updated Vital Signs ?BP (!) 146/90   Pulse 100   Temp 98.6 ?F (37 ?C)   Resp 20   SpO2 95%  ? ?Visual Acuity ?Right Eye Distance:   ?Left Eye Distance:   ?  Bilateral Distance:   ? ?Right Eye Near:   ?Left Eye Near:    ?Bilateral Near:    ? ?Physical Exam ?Vitals and nursing note reviewed.  ?Constitutional:   ?   General: He is not in acute distress. ?   Appearance: He is well-developed. He is obese. He is not ill-appearing.  ?HENT:  ?   Right Ear: Tympanic membrane normal.  ?   Left Ear: Tympanic membrane normal.  ?   Nose: Nose normal.  ?   Mouth/Throat:  ?   Mouth: Mucous membranes are moist.  ?   Comments: Clear PND. ?Eyes:  ?   Conjunctiva/sclera: Conjunctivae normal.  ?Cardiovascular:  ?   Rate and Rhythm: Normal rate and regular rhythm.  ?   Heart sounds: Normal heart sounds.  ?Pulmonary:  ?   Effort: Pulmonary effort is normal. No respiratory distress.  ?   Breath sounds: Normal breath sounds.  ?Musculoskeletal:  ?   Cervical back: Neck supple.  ?Skin: ?   General: Skin is warm and dry.  ?Neurological:  ?   Mental Status: He is alert.  ?Psychiatric:     ?   Mood and Affect: Mood normal.     ?   Behavior: Behavior normal.  ? ? ? ?UC Treatments / Results   ?Labs ?(all labs ordered are listed, but only abnormal results are displayed) ?Labs Reviewed  ?COVID-19, FLU A+B AND RSV  ? ? ?EKG ? ? ?Radiology ?No results found. ? ?Procedures ?Procedures (including critical care time) ? ?Medications Ordered in UC ?Medications - No data to display ? ?Initial Impression / Assessment and Plan / UC Course  ?I have reviewed the triage vital signs and the nursing notes. ? ?Pertinent labs & imaging results that were available during my care of the patient were reviewed by me and considered in my medical decision making (see chart for details). ? ?Viral URI with cough.  COVID, Flu, RSV pending.  Instructed patient to self quarantine per CDC guidelines.  Treating cough with Tessalon Perles.  Discussed symptomatic treatment including plain Mucinex, Tylenol or ibuprofen, rest, hydration.  Instructed patient to follow up with PCP if symptoms are not improving.  Patient agrees to plan of care. ? ? ? ?Final Clinical Impressions(s) / UC Diagnoses  ? ?Final diagnoses:  ?Viral URI with cough  ? ? ? ?Discharge Instructions   ? ?  ?Your COVID, Flu, and RSV tests are pending.  You should self quarantine until the test results are back.   ? ?Take the [facility] as prescribed for cough.  Take plain Mucinex as needed for congestion.  Take Tylenol or ibuprofen as needed for fever or discomfort.  Rest and keep yourself hydrated.   ? ?Follow-up with your primary care provider if your symptoms are not improving.   ? ? ? ? ? ? ?ED Prescriptions   ? ? Medication Sig Dispense Auth. Provider  ? benzonatate (TESSALON) 100 MG capsule Take 1 capsule (100 mg total) by mouth 3 (three) times daily as needed for cough. 21 capsule Mickie Bail, NP  ? ?  ? ?PDMP not reviewed this encounter. ?  ?Mickie Bail, NP ?09/25/21 [phone] ? ?

## 2021-09-27 ENCOUNTER — Ambulatory Visit: Payer: BC Managed Care – PPO | Admitting: Psychiatry

## 2021-09-27 LAB — COVID-19, FLU A+B AND RSV
Influenza A, NAA: NOT DETECTED
Influenza B, NAA: NOT DETECTED
RSV, NAA: NOT DETECTED
SARS-CoV-2, NAA: NOT DETECTED

## 2021-10-16 ENCOUNTER — Other Ambulatory Visit: Payer: Self-pay | Admitting: Psychiatry

## 2021-10-16 DIAGNOSIS — F319 Bipolar disorder, unspecified: Secondary | ICD-10-CM

## 2021-10-24 ENCOUNTER — Ambulatory Visit: Payer: BC Managed Care – PPO | Admitting: Psychiatry

## 2021-11-13 ENCOUNTER — Other Ambulatory Visit: Payer: Self-pay | Admitting: Psychiatry

## 2021-11-13 DIAGNOSIS — F319 Bipolar disorder, unspecified: Secondary | ICD-10-CM

## 2021-11-22 ENCOUNTER — Ambulatory Visit: Payer: BC Managed Care – PPO | Admitting: Psychiatry

## 2021-11-22 ENCOUNTER — Encounter: Payer: Self-pay | Admitting: Psychiatry

## 2021-11-22 DIAGNOSIS — G4733 Obstructive sleep apnea (adult) (pediatric): Secondary | ICD-10-CM

## 2021-11-22 DIAGNOSIS — F319 Bipolar disorder, unspecified: Secondary | ICD-10-CM | POA: Diagnosis not present

## 2021-11-22 DIAGNOSIS — F411 Generalized anxiety disorder: Secondary | ICD-10-CM

## 2021-11-22 MED ORDER — LAMOTRIGINE 200 MG PO TABS: 200.0000 mg | ORAL_TABLET | Freq: Every day | ORAL | 3 refills | Status: DC

## 2021-11-22 MED ORDER — ARIPIPRAZOLE 10 MG PO TABS: 10.0000 mg | ORAL_TABLET | Freq: Every day | ORAL | 3 refills | Status: DC

## 2021-11-22 NOTE — Progress Notes (Signed)
Alan Norris [id number] 06-Dec-1980 41 y.o.   Subjective:   Patient ID:  Alan Norris is a 41 y.o. (DOB [birth year]) male.  Chief Complaint:  Chief Complaint  Patient presents with   Follow-up    HPI Alan Norris presents today for follow-up of bipolar 1 disorder and generalized anxiety disorder.   seen in January 2021.  No meds changed   Had moved to [place].  To help parents.  Had some depression while there and the lamotrigine was increased from 50 mg to 200 mg with good result about October 2019. Moved back Feb 2020.   12/30/2019 appointment with the following noted: I feel stable.  Doing well with work and school. Doing real good. Mood very stable since last here.   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  Meds working.  Consistent with meds except middle dose gabapentin. Gabapentin Rx and helps anxiety.   No SE. Plan: No med changes indicated today.  Continue lamotrigine 200 Abilify 10 Gabapentin 100 TID  09/28/2020 appointment with the following noted: Pretty good.  No mood swings.  Nothing new to report.  Busy with work and single father tghing.  Good 9 months.  Loves job and anxiety.   Anxiety a lot lower at new job. Plan: No med changes indicated today.  Continue lamotrigine 200 Abilify 10 Gabapentin 100 TID.  Option wean if wants on this.  Disc risk more anxiety.  He may try weaning it.  11/22/2021 appointment noted: No problems off gabapentin.  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  Going well. No SE Dx OSA, hard to adjust CPAP.  Tired during the day.  Patient reports stable mood and denies depressed or irritable moods since increase in lamotrigine.  Patient denies any recent difficulty with anxiety.  Patient denies difficulty with sleep initiation or maintenance. Denies appetite disturbance.  Patient reports that energy and motivation have been good.  Patient denies any difficulty with concentration.   Patient denies any suicidal ideation.  Past Psychiatric Medication Trials:  CBZ, Abilify, lamotrigine, gabapentin used for anxiety   Review of Systems:  Review of Systems  Respiratory:  Negative for cough and shortness of breath.   Neurological:  Negative for dizziness and tremors.    Medications: I have reviewed the patient's current medications.  Current Outpatient Medications  Medication Sig Dispense Refill   ARIPiprazole (ABILIFY) 10 MG tablet TAKE ONE TABLET BY MOUTH DAILY 90 tablet 0   benzonatate (TESSALON) 100 MG capsule Take 1 capsule (100 mg total) by mouth 3 (three) times daily as needed for cough. 21 capsule 0   lamoTRIgine (LAMICTAL) 200 MG tablet TAKE ONE TABLET BY MOUTH DAILY 90 tablet 0   Multiple Vitamins-Minerals (MULTIVITAMIN ADULT) CHEW Chew by mouth.     gabapentin (NEURONTIN) 100 MG capsule Take 1 capsule (100 mg total) by mouth 3 (three) times daily. (Patient not taking: Reported on 11/22/2021) 12 capsule 0   No current facility-administered medications for this visit.    Medication Side Effects: None  Allergies:  Allergies  Allergen Reactions   Amoxicillin Anaphylaxis    unknown   Amoxicillin-Pot Clavulanate Other (See Comments)    Past Medical History:  Diagnosis Date   Depression     Family History  Problem Relation Age of Onset   Hypertension Father     Social History   Socioeconomic History   Marital status: Divorced    Spouse name: Not on file   Number  of children: Not on file   Years of education: Not on file   Highest education level: Not on file  Occupational History   Not on file  Tobacco Use   Smoking status: Former   Smokeless tobacco: Never  Vaping Use   Vaping Use: Never used  Substance and Sexual Activity   Alcohol use: No   Drug use: No   Sexual activity: Not on file  Other Topics Concern   Not on file  Social History Narrative   Not on file   Social Determinants of Health   Financial Resource Strain: Not on  file  Food Insecurity: Not on file  Transportation Needs: Not on file  Physical Activity: Not on file  Stress: Not on file  Social Connections: Not on file  Intimate Partner Violence: Not on file    Past Medical History, Surgical history, Social history, and Family history were reviewed and updated as appropriate.   Please see review of systems for further details on the patient's review from today.   Objective:   Physical Exam:  There were no vitals taken for this visit.  Physical Exam Constitutional:      General: He is not in acute distress.    Appearance: He is well-developed. He is obese.  Musculoskeletal:        General: No deformity.  Neurological:     Mental Status: He is alert and oriented to person, place, and time.     Cranial Nerves: No dysarthria.     Motor: No tremor.     Coordination: Coordination normal.  Psychiatric:        Attention and Perception: Attention and perception normal. He does not perceive auditory or visual hallucinations.        Mood and Affect: Mood normal. Mood is not anxious or depressed. Affect is not labile, blunt, angry or inappropriate.        Speech: Speech normal.        Behavior: Behavior normal. Behavior is not slowed. Behavior is cooperative.        Thought Content: Thought content normal. Thought content is not paranoid or delusional. Thought content does not include homicidal or suicidal ideation. Thought content does not include suicidal plan.        Cognition and Memory: Cognition and memory normal.        Judgment: Judgment normal.     Comments: Satisfied and good response.     Lab Review:     Component Value Date/Time   NA 136 12/06/2020 1110   NA 141 12/25/2011 0940   K 4.2 12/06/2020 1110   K 4.4 12/25/2011 0940   CL 103 12/06/2020 1110   CL 102 12/25/2011 0940   CO2 28 12/06/2020 1110   CO2 29 12/25/2011 0940   GLUCOSE 113 (H) 12/06/2020 1110   GLUCOSE 105 (H) 12/25/2011 0940   BUN 16 12/06/2020 1110   BUN 11  12/25/2011 0940   CREATININE 0.86 12/06/2020 1110   CREATININE 0.92 12/25/2011 0940   CALCIUM 9.0 12/06/2020 1110   CALCIUM 9.5 12/25/2011 0940   PROT 7.3 12/22/2019 0011   PROT 8.1 12/25/2011 0940   ALBUMIN 3.8 12/22/2019 0011   ALBUMIN 3.9 12/25/2011 0940   AST 40 12/22/2019 0011   AST 46 (H) 12/25/2011 0940   ALT 54 (H) 12/22/2019 0011   ALT 92 (H) 12/25/2011 0940   ALKPHOS 57 12/22/2019 0011   ALKPHOS 73 12/25/2011 0940   BILITOT 0.5 12/22/2019 0011   BILITOT  0.6 12/25/2011 0940   GFRNONAA >60 12/06/2020 1110   GFRNONAA >60 12/25/2011 0940   GFRAA >60 12/22/2019 0011   GFRAA >60 12/25/2011 0940       Component Value Date/Time   WBC 11.5 (H) 12/06/2020 1110   RBC 5.34 12/06/2020 1110   HGB 15.8 12/06/2020 1110   HGB 15.2 12/25/2011 0940   HCT 46.9 12/06/2020 1110   HCT 45.4 12/25/2011 0940   PLT 444 (H) 12/06/2020 1110   PLT 373 12/25/2011 0940   MCV 87.8 12/06/2020 1110   MCV 88 12/25/2011 0940   MCH 29.6 12/06/2020 1110   MCHC 33.7 12/06/2020 1110   RDW 13.2 12/06/2020 1110   RDW 13.9 12/25/2011 0940   LYMPHSABS 1.8 12/22/2019 0011   LYMPHSABS 3.0 12/25/2011 0940   MONOABS 1.5 (H) 12/22/2019 0011   MONOABS 1.2 (H) 12/25/2011 0940   EOSABS 0.5 12/22/2019 0011   EOSABS 0.3 12/25/2011 0940   BASOSABS 0.1 12/22/2019 0011   BASOSABS 0.1 12/25/2011 0940    No results found for: "POCLITH", "LITHIUM"   No results found for: "PHENYTOIN", "PHENOBARB", "VALPROATE", "CBMZ"   .res Assessment: Plan:    Bipolar I disorder (HCC)  Generalized anxiety disorder  Obstructive sleep apnea   Alan Norris was first seen in our clinic in June 2019 diagnosed about 2016- 2017 at [facility].  Has continued in counseling as needed.  Good response to the increase in lamotrigine while he was in [place] helping take care of his father's back injury.  .  Satisfied with the medications now  Discussed potential metabolic side effects associated with atypical antipsychotics,  as well as potential risk for movement side effects. Advised pt to contact office if movement side effects occur.  PCP monitors labs No AIM  Emphasized need for consistency with lamotrigine because of the rash risk.  He understands that.  Encourage keep trying  with CPAP and explained brain health reasons to lose weight.  No med changes indicated today.  Continue lamotrigine 200 Abilify 10 No problems off gabapentin  Follow-up 12 months bc of stability or sooner as needed.  Meredith Staggers MD, DFAPA  Please see After Visit Summary for patient specific instructions.  No future appointments.  No orders of the defined types were placed in this encounter.     -------------------------------

## 2022-01-24 ENCOUNTER — Telehealth: Payer: Self-pay | Admitting: Psychiatry

## 2022-01-24 NOTE — Telephone Encounter (Signed)
Pt called reporting, since Sunday 8/13 he has not feeling like himself. Very anxious. Had been doing great. Last apt 6/15 follow up in a year. Contact pt @ [phone]

## 2022-01-24 NOTE — Telephone Encounter (Signed)
Patient called and stating he is very anxious. When I called him back he said he is out of his head with anxiety, stressed about everything, difficulty getting to sleep due to racing thoughts. Once he gets to sleep he can sleep about 5 hours before waking. Denies caffeine use for the past month. In your notes you mention him not being consistent with CPAP. He said he had gotten a new mask and he has been consistent with use for the past 2 months. "It finally clicked". 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 He had been trying to figure out a way to make money but nothing panned out and he has been having financial difficulties. He said that he goes back to work tomorrow. Sounds like anxiety is more situational given his stability the last few years. He voiced no other concerns.   Pharmacy:  [facility] at [place], [place].

## 2022-03-20 ENCOUNTER — Ambulatory Visit: Payer: BLUE CROSS/BLUE SHIELD

## 2022-07-02 IMAGING — CR DG CHEST 2V
2 series · 2 of 2 positions shown · non-contrast
Comparison: 12/19/2019

CLINICAL DATA: Dyspnea

EXAM:
CHEST - 2 VIEW

[chest pa]
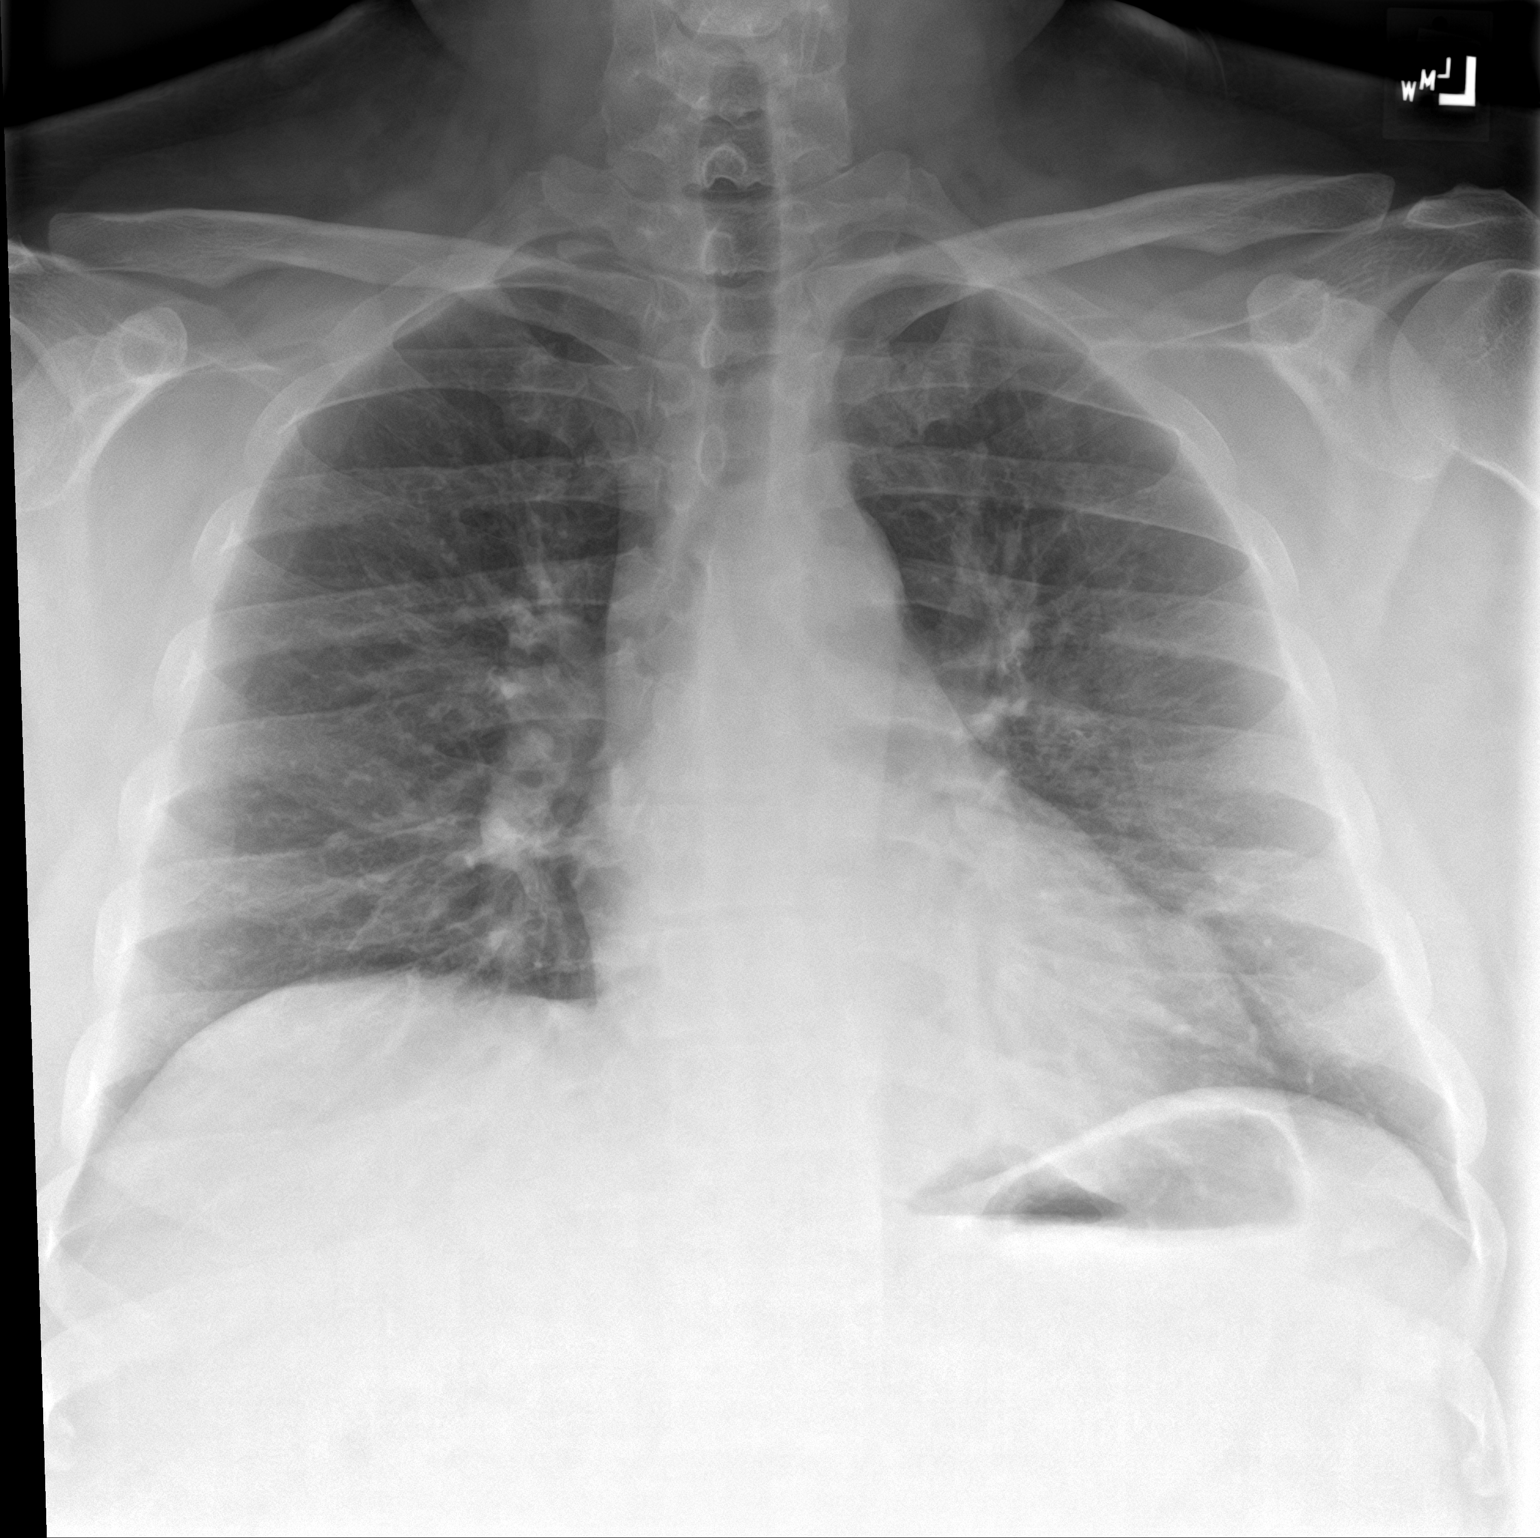

[chest lat]
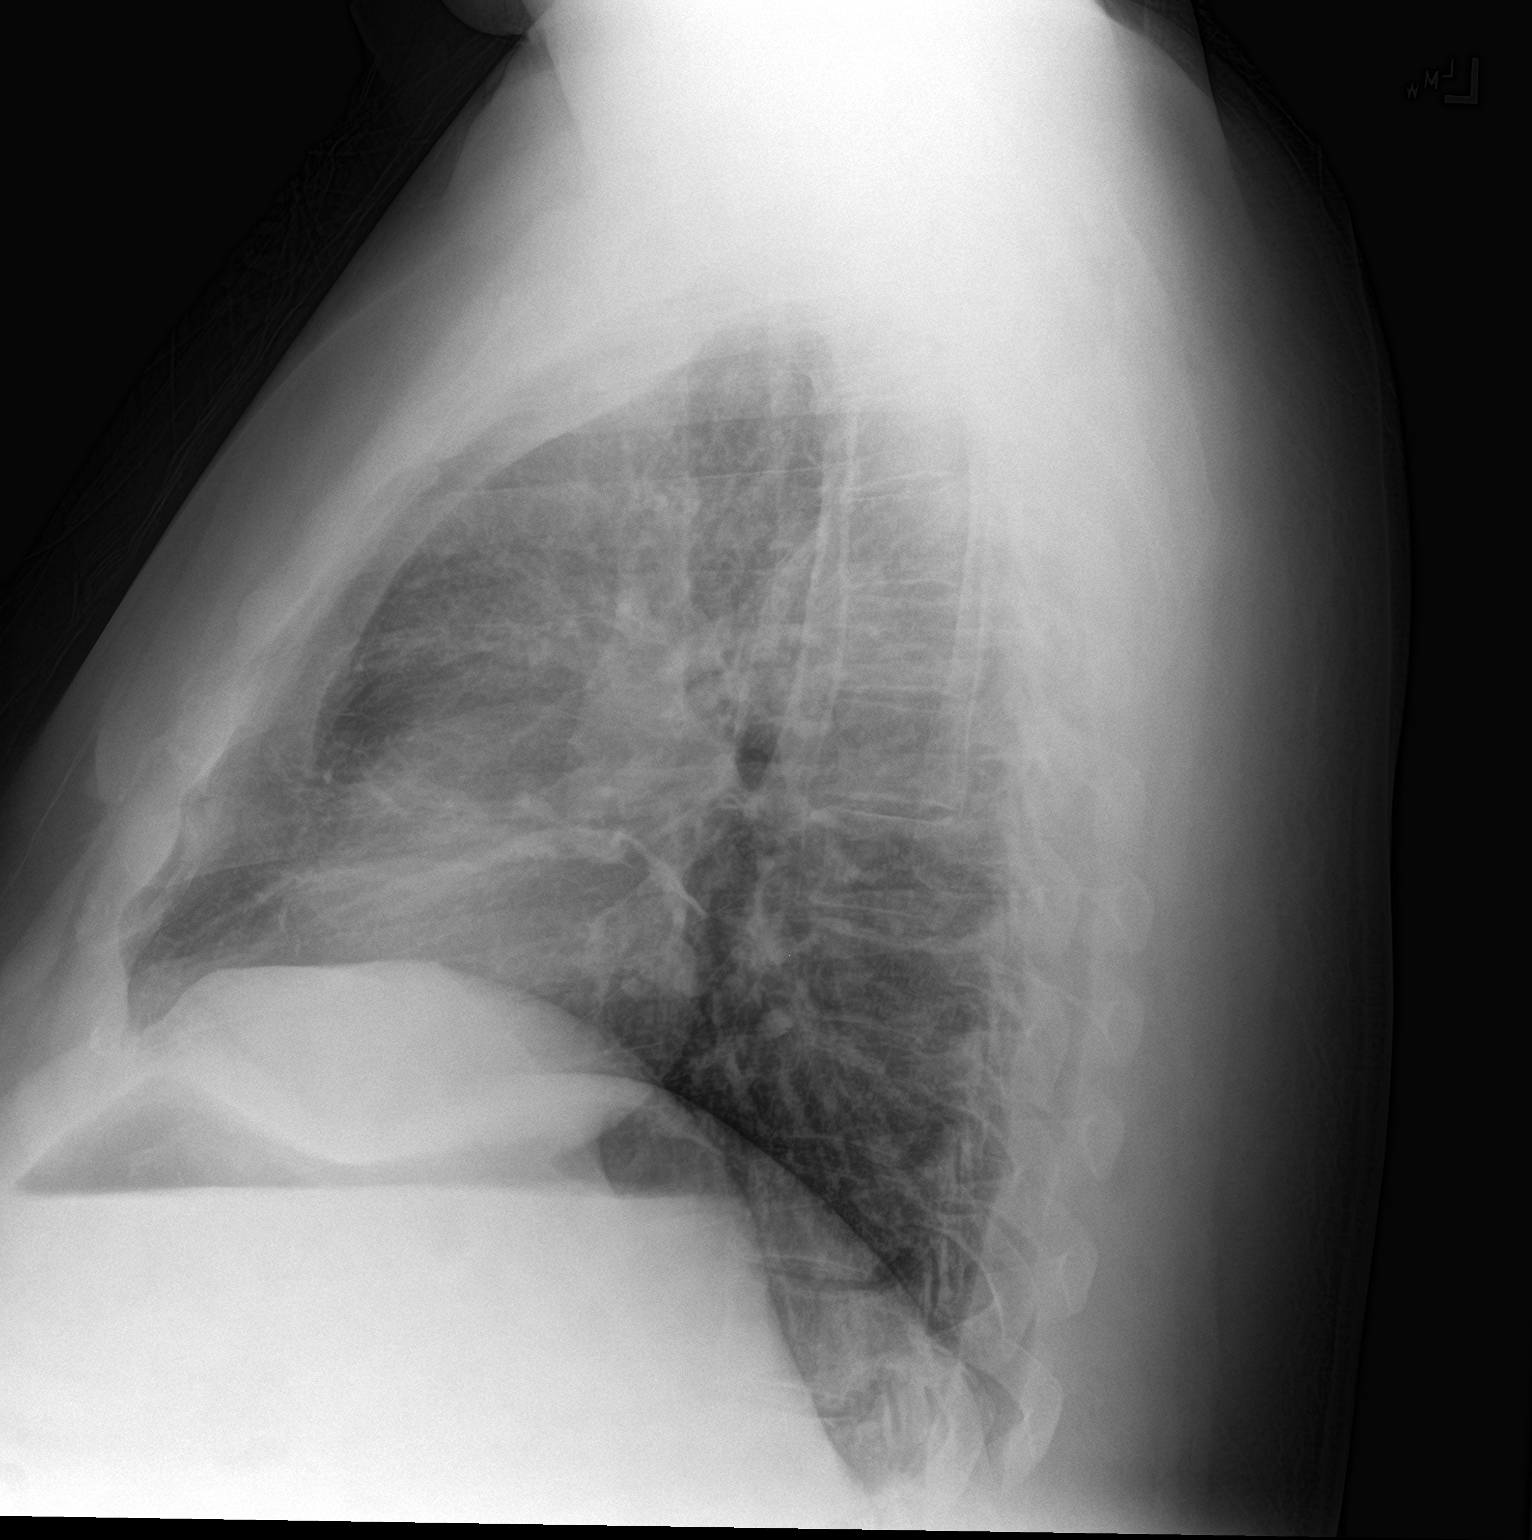

[2 of 2 positions shown; findings below may reference images not displayed]

FINDINGS: Minimal right middle lobe atelectasis. Lungs are otherwise clear. No
pneumothorax or pleural effusion. Cardiac size within normal limits.
Pulmonary vascularity normal. No acute bone abnormality.
IMPRESSION: No active cardiopulmonary disease.

## 2022-08-09 ENCOUNTER — Other Ambulatory Visit: Payer: Self-pay

## 2022-08-09 DIAGNOSIS — R7401 Elevation of levels of liver transaminase levels: Secondary | ICD-10-CM

## 2022-08-21 ENCOUNTER — Ambulatory Visit: Payer: BC Managed Care – PPO

## 2022-11-21 ENCOUNTER — Encounter: Payer: Self-pay | Admitting: Psychiatry

## 2022-11-21 ENCOUNTER — Ambulatory Visit: Payer: BC Managed Care – PPO | Admitting: Psychiatry

## 2022-11-21 DIAGNOSIS — F319 Bipolar disorder, unspecified: Secondary | ICD-10-CM

## 2022-11-21 DIAGNOSIS — G4733 Obstructive sleep apnea (adult) (pediatric): Secondary | ICD-10-CM | POA: Diagnosis not present

## 2022-11-21 DIAGNOSIS — F411 Generalized anxiety disorder: Secondary | ICD-10-CM

## 2022-11-21 MED ORDER — LAMOTRIGINE 200 MG PO TABS: 200.0000 mg | ORAL_TABLET | Freq: Every day | ORAL | 3 refills | Status: DC

## 2022-11-21 MED ORDER — ARIPIPRAZOLE 10 MG PO TABS: 10.0000 mg | ORAL_TABLET | Freq: Every day | ORAL | 3 refills | Status: DC

## 2022-11-21 NOTE — Progress Notes (Signed)
Alan Norris [id number] 1980-12-29 42 y.o.   Subjective:   Patient ID:  Alan Norris is a 42 y.o. (DOB 1980/09/23) male.  Chief Complaint:  Chief Complaint  Patient presents with   Follow-up    HPI Alan Norris presents today for follow-up of bipolar 1 disorder and generalized anxiety disorder.   seen in January 2021.  No meds changed   Had moved to [place].  To help parents.  Had some depression while there and the lamotrigine was increased from 50 mg to 200 mg with good result about October 2019. Moved back Feb 2020.   12/30/2019 appointment with the following noted: I feel stable.  Doing well with work and school. Doing real good. Mood very stable since last here.   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  Meds working.  Consistent with meds except middle dose gabapentin. Gabapentin Rx and helps anxiety.   No SE. Plan: No med changes indicated today.  Continue lamotrigine 200 Abilify 10 Gabapentin 100 TID  09/28/2020 appointment with the following noted: Pretty good.  No mood swings.  Nothing new to report.  Busy with work and single father tghing.  Good 9 months.  Loves job and anxiety.   Anxiety a lot lower at new job. Plan: No med changes indicated today.  Continue lamotrigine 200 Abilify 10 Gabapentin 100 TID.  Option wean if wants on this.  Disc risk more anxiety.  He may try weaning it.  11/22/2021 appointment noted: No problems off gabapentin.  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  Going well. No SE Dx OSA, hard to adjust CPAP.  Tired during the day.  11/21/22 appt noted:   Med: lamotrigine 200, Abilify 10 mg  Good mood.  Satisfied with meds.  No SE. Dx fatty liver dz.   CPAP bothering him.  Trying to get in touch with neuro about it. Patient reports stable mood and denies depressed or irritable moods since increase in lamotrigine.  Patient denies any recent difficulty with anxiety.  Patient denies  difficulty with sleep initiation or maintenance. Denies appetite disturbance.  Patient reports that energy and motivation have been good.  Patient denies any difficulty with concentration.  Patient denies any suicidal ideation.  Past Psychiatric Medication Trials:  CBZ, Abilify, lamotrigine, gabapentin used for anxiety   Review of Systems:  Review of Systems  Neurological:  Negative for dizziness and tremors.  Psychiatric/Behavioral:  Positive for sleep disturbance.     Medications: I have reviewed the patient's current medications.  Current Outpatient Medications  Medication Sig Dispense Refill   benzonatate (TESSALON) 100 MG capsule Take 1 capsule (100 mg total) by mouth 3 (three) times daily as needed for cough. 21 capsule 0   chlorthalidone (HYGROTON) 25 MG tablet Take 1 tablet by mouth daily.     lisinopril (ZESTRIL) 5 MG tablet Take 5 mg by mouth daily.     Multiple Vitamins-Minerals (MULTIVITAMIN ADULT) CHEW Chew by mouth.     ARIPiprazole (ABILIFY) 10 MG tablet Take 1 tablet (10 mg total) by mouth daily. 90 tablet 3   lamoTRIgine (LAMICTAL) 200 MG tablet Take 1 tablet (200 mg total) by mouth daily. 90 tablet 3   No current facility-administered medications for this visit.    Medication Side Effects: None  Allergies:  Allergies  Allergen Reactions   Amoxicillin Anaphylaxis    unknown   Amoxicillin-Pot Clavulanate Other (See Comments)    Past Medical History:  Diagnosis Date  Depression     Family History  Problem Relation Age of Onset   Hypertension Father     Social History   Socioeconomic History   Marital status: Divorced    Spouse name: Not on file   Number of children: Not on file   Years of education: Not on file   Highest education level: Not on file  Occupational History   Not on file  Tobacco Use   Smoking status: Former   Smokeless tobacco: Never  Vaping Use   Vaping Use: Never used  Substance and Sexual Activity   Alcohol use: No   Drug  use: No   Sexual activity: Not on file  Other Topics Concern   Not on file  Social History Narrative   Not on file   Social Determinants of Health   Financial Resource Strain: Not on file  Food Insecurity: Not on file  Transportation Needs: Not on file  Physical Activity: Not on file  Stress: Not on file  Social Connections: Not on file  Intimate Partner Violence: Not on file    Past Medical History, Surgical history, Social history, and Family history were reviewed and updated as appropriate.   Please see review of systems for further details on the patient's review from today.   Objective:   Physical Exam:  There were no vitals taken for this visit.  Physical Exam Constitutional:      General: He is not in acute distress.    Appearance: He is well-developed. He is obese.  Musculoskeletal:        General: No deformity.  Neurological:     Mental Status: He is alert and oriented to person, place, and time.     Cranial Nerves: No dysarthria.     Motor: No tremor.     Coordination: Coordination normal.  Psychiatric:        Attention and Perception: Attention and perception normal. He does not perceive auditory or visual hallucinations.        Mood and Affect: Mood normal. Mood is not anxious or depressed. Affect is not labile, blunt or angry.        Speech: Speech normal.        Behavior: Behavior normal. Behavior is not slowed. Behavior is cooperative.        Thought Content: Thought content normal. Thought content is not paranoid or delusional. Thought content does not include homicidal or suicidal ideation. Thought content does not include suicidal plan.        Cognition and Memory: Cognition and memory normal.        Judgment: Judgment normal.     Comments: Satisfied and good response.     Lab Review:     Component Value Date/Time   NA 136 12/06/2020 1110   NA 141 12/25/2011 0940   K 4.2 12/06/2020 1110   K 4.4 12/25/2011 0940   CL 103 12/06/2020 1110   CL  102 12/25/2011 0940   CO2 28 12/06/2020 1110   CO2 29 12/25/2011 0940   GLUCOSE 113 (H) 12/06/2020 1110   GLUCOSE 105 (H) 12/25/2011 0940   BUN 16 12/06/2020 1110   BUN 11 12/25/2011 0940   CREATININE 0.86 12/06/2020 1110   CREATININE 0.92 12/25/2011 0940   CALCIUM 9.0 12/06/2020 1110   CALCIUM 9.5 12/25/2011 0940   PROT 7.3 12/22/2019 0011   PROT 8.1 12/25/2011 0940   ALBUMIN 3.8 12/22/2019 0011   ALBUMIN 3.9 12/25/2011 0940   AST 40 12/22/2019 0011  AST 46 (H) 12/25/2011 0940   ALT 54 (H) 12/22/2019 0011   ALT 92 (H) 12/25/2011 0940   ALKPHOS 57 12/22/2019 0011   ALKPHOS 73 12/25/2011 0940   BILITOT 0.5 12/22/2019 0011   BILITOT 0.6 12/25/2011 0940   GFRNONAA >60 12/06/2020 1110   GFRNONAA >60 12/25/2011 0940   GFRAA >60 12/22/2019 0011   GFRAA >60 12/25/2011 0940       Component Value Date/Time   WBC 11.5 (H) 12/06/2020 1110   RBC 5.34 12/06/2020 1110   HGB 15.8 12/06/2020 1110   HGB 15.2 12/25/2011 0940   HCT 46.9 12/06/2020 1110   HCT 45.4 12/25/2011 0940   PLT 444 (H) 12/06/2020 1110   PLT 373 12/25/2011 0940   MCV 87.8 12/06/2020 1110   MCV 88 12/25/2011 0940   MCH 29.6 12/06/2020 1110   MCHC 33.7 12/06/2020 1110   RDW 13.2 12/06/2020 1110   RDW 13.9 12/25/2011 0940   LYMPHSABS 1.8 12/22/2019 0011   LYMPHSABS 3.0 12/25/2011 0940   MONOABS 1.5 (H) 12/22/2019 0011   MONOABS 1.2 (H) 12/25/2011 0940   EOSABS 0.5 12/22/2019 0011   EOSABS 0.3 12/25/2011 0940   BASOSABS 0.1 12/22/2019 0011   BASOSABS 0.1 12/25/2011 0940    No results found for: "POCLITH", "LITHIUM"   No results found for: "PHENYTOIN", "PHENOBARB", "VALPROATE", "CBMZ"   .res Assessment: Plan:    Bipolar I disorder (HCC) - Plan: ARIPiprazole (ABILIFY) 10 MG tablet, lamoTRIgine (LAMICTAL) 200 MG tablet  Generalized anxiety disorder  Obstructive sleep apnea   Cameron was first seen in our clinic in June 2019 diagnosed about 2016- 2017 at [facility].  Has continued in  counseling as needed.  Good response to the increase in lamotrigine while he was in [place] helping take care of his father's back injury.  .  Satisfied with the medications now  Discussed potential metabolic side effects associated with atypical antipsychotics, as well as potential risk for movement side effects. Advised pt to contact office if movement side effects occur.  PCP monitors labs No AIM  Emphasized need for consistency with lamotrigine because of the rash risk.  He understands that.  Encourage keep trying  with CPAP and explained brain health reasons to lose weight again.   He will contact neuro about it.    No med changes indicated today.  Continue lamotrigine 200 Abilify 10  Follow-up 12 months bc of stability or sooner as needed.  Meredith Staggers MD, DFAPA  Please see After Visit Summary for patient specific instructions.  No future appointments.  No orders of the defined types were placed in this encounter.     -------------------------------

## 2023-06-17 IMAGING — CR DG CHEST 2V
1 series · 2 of 2 positions shown · non-contrast
Comparison: 12/22/2019

CLINICAL DATA: Chest pain and dizziness

EXAM:
CHEST - 2 VIEW

[Series 1: dg chest 2 view · 0.14mm/px · 2 of 2 slices shown]
[im 1/2]
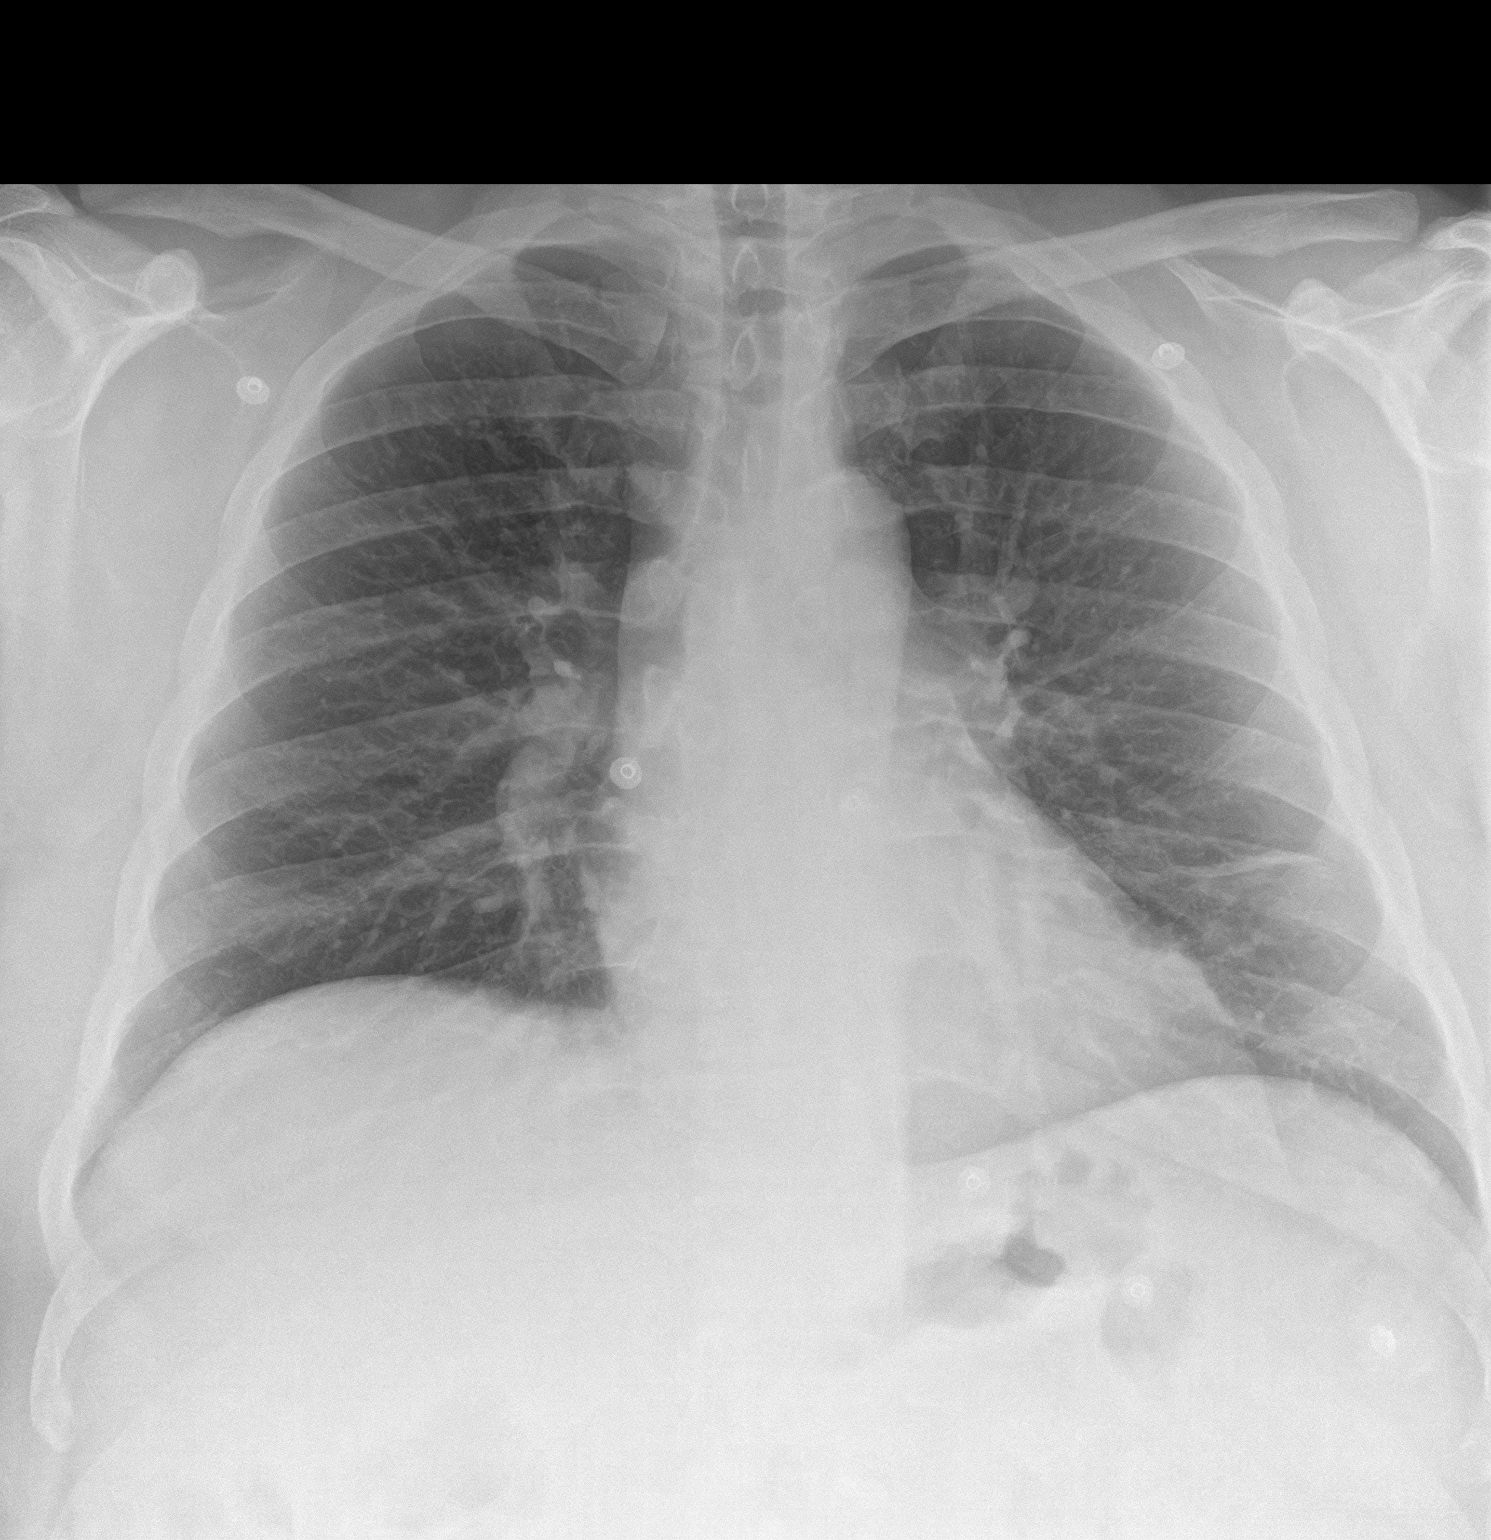
[im 2/2]
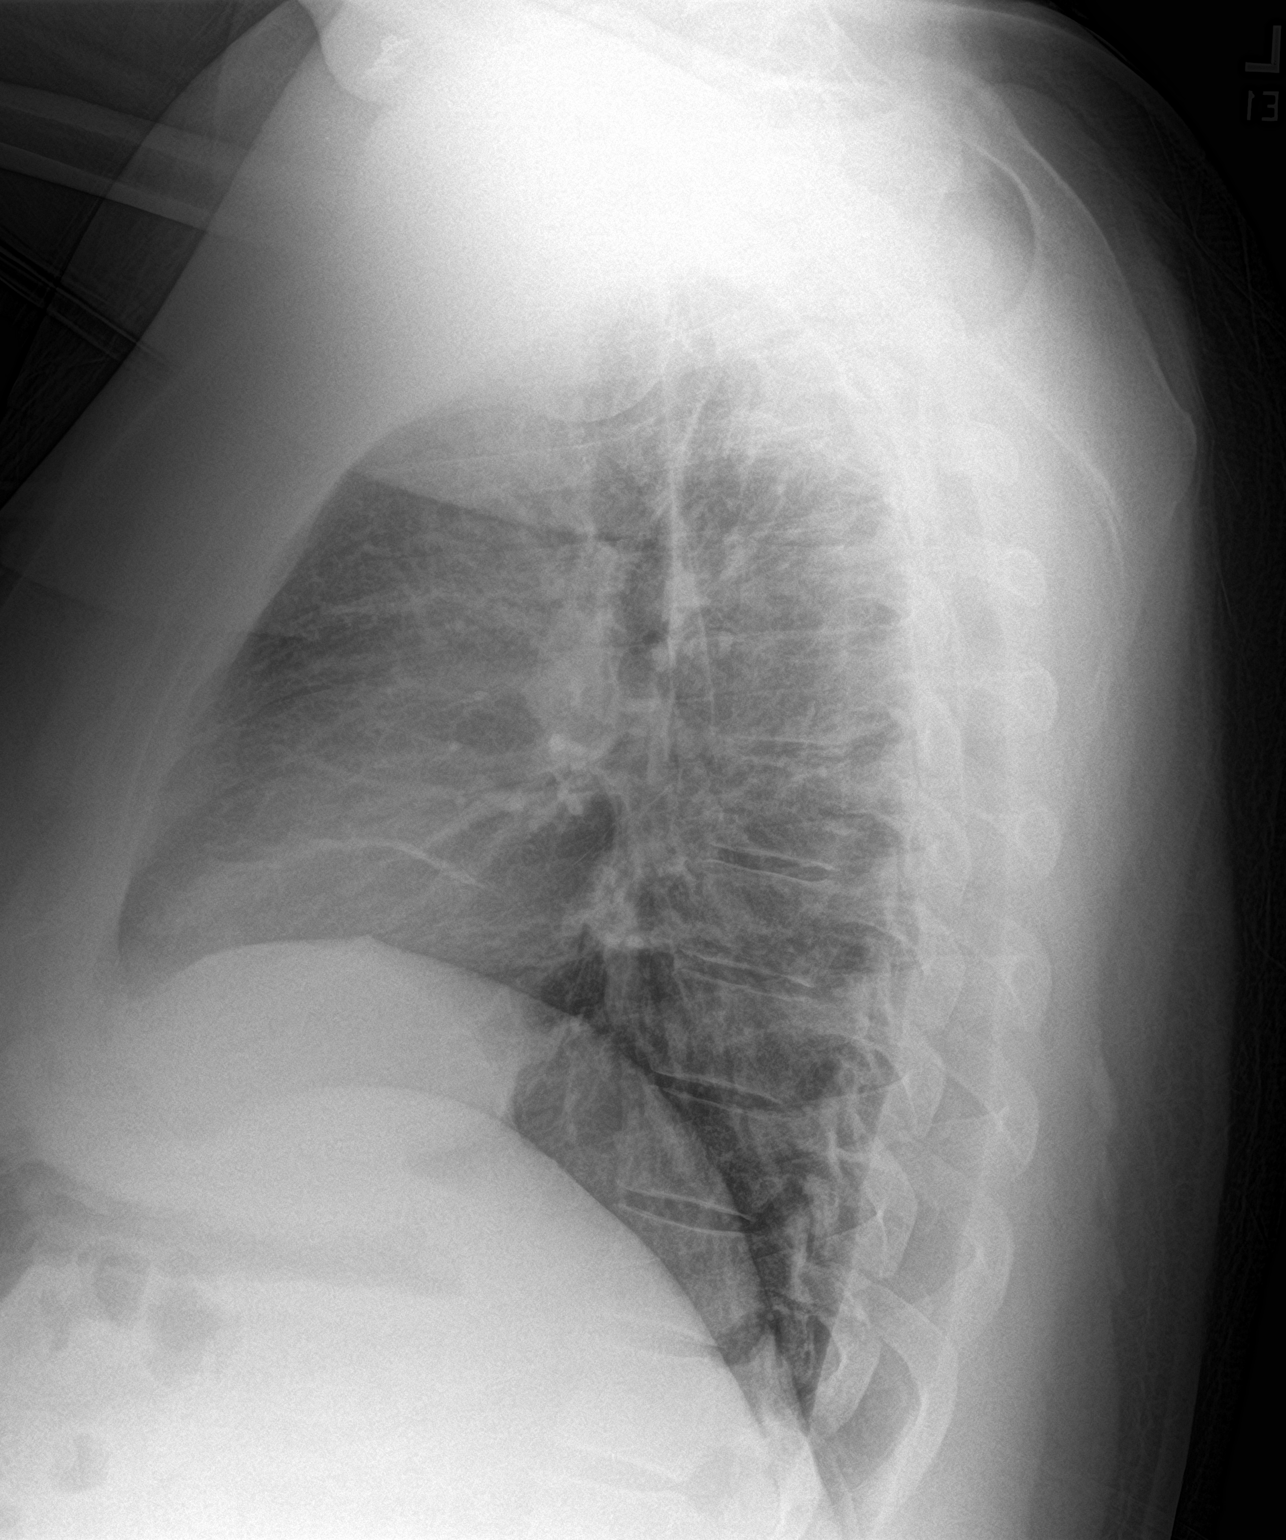

[2 of 2 positions shown; findings below may reference images not displayed]

FINDINGS: Cardiac shadow is within normal limits. Mild basilar atelectasis is
noted on the left. No focal infiltrate or effusion is seen. No bony
abnormality is noted.
IMPRESSION: Mild left basilar atelectasis.

## 2023-11-24 ENCOUNTER — Encounter: Payer: Self-pay | Admitting: Psychiatry

## 2023-11-24 ENCOUNTER — Ambulatory Visit: Payer: BC Managed Care – PPO | Admitting: Psychiatry

## 2023-11-24 DIAGNOSIS — F411 Generalized anxiety disorder: Secondary | ICD-10-CM

## 2023-11-24 DIAGNOSIS — G4733 Obstructive sleep apnea (adult) (pediatric): Secondary | ICD-10-CM

## 2023-11-24 DIAGNOSIS — F319 Bipolar disorder, unspecified: Secondary | ICD-10-CM

## 2023-11-24 MED ORDER — LAMOTRIGINE 200 MG PO TABS: 200.0000 mg | ORAL_TABLET | Freq: Every day | ORAL | 3 refills | Status: DC

## 2023-11-24 MED ORDER — ARIPIPRAZOLE 10 MG PO TABS: 10.0000 mg | ORAL_TABLET | Freq: Every day | ORAL | 3 refills | Status: AC

## 2023-11-24 NOTE — Progress Notes (Signed)
 Alan Norris [id number] Nov 08, 1980 43 y.o.   Subjective:   Patient ID:  Alan Norris is a 43 y.o. (DOB 1981/01/27) male.  Chief Complaint:  Chief Complaint  Patient presents with   Follow-up    HPI Alan Norris presents today for follow-up of bipolar 1 disorder and generalized anxiety disorder.   seen in January 2021.  No meds changed   Had moved to [place].  To help parents.  Had some depression while there and the lamotrigine  was increased from 50 mg to 200 mg with good result about October 2019. Moved back Feb 2020.   12/30/2019 appointment with the following noted: I feel stable.  Doing well with work and school. Doing real good. Mood very stable since last here.   Back to school for [employer] education for teaching.  Meds working.  Consistent with meds except middle dose gabapentin . Gabapentin  Rx and helps anxiety.   No SE. Plan: No med changes indicated today.  Continue lamotrigine  200 Abilify  10 Gabapentin  100 TID  09/28/2020 appointment with the following noted: Pretty good.  No mood swings.  Nothing new to report.  Busy with work and single father tghing.  Good 9 months.  Loves job and anxiety.   Anxiety a lot lower at new job. Plan: No med changes indicated today.  Continue lamotrigine  200 Abilify  10 Gabapentin  100 TID.  Option wean if wants on this.  Disc risk more anxiety.  He may try weaning it.  11/22/2021 appointment noted: No problems off gabapentin .  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  Going well. No SE Dx OSA, hard to adjust CPAP.  Tired during the day.  11/21/22 appt noted:   Med: lamotrigine  200, Abilify  10 mg  Good mood.  Satisfied with meds.  No SE. Dx fatty liver dz.   CPAP bothering him.  Trying to get in touch with neuro about it. Patient reports stable mood and denies depressed or irritable moods since increase in lamotrigine .  Patient denies any recent difficulty with anxiety.  Patient denies  difficulty with sleep initiation or maintenance. Denies appetite disturbance.  Patient reports that energy and motivation have been good.  Patient denies any difficulty with concentration.  Patient denies any suicidal ideation. Plan no changes'  11/24/23 appt noted:  Med: lamotrigine  200, Abilify  10 mg  Continues to do pretty well.   Tolerating psych meds fine.  No changes desired.  Good response to meds.  No mood swings. Patient reports stable mood and denies depressed or irritable moods.  Patient denies any recent difficulty with anxiety.  Patient denies difficulty with sleep initiation or maintenance. Denies appetite disturbance.  Patient reports that energy and motivation have been good.  Patient denies any difficulty with concentration.  Patient denies any suicidal ideation. Going to gym.  Sleep better than in the past. Getting married in July.    Past Psychiatric Medication Trials:   CBZ, Abilify , lamotrigine ,  gabapentin  100 TID used for anxiety [facility]   Review of Systems:  Review of Systems  Neurological:  Negative for dizziness and tremors.  Psychiatric/Behavioral:  Positive for sleep disturbance. Negative for dysphoric mood. The patient is not nervous/anxious.     Medications: I have reviewed the patient's current medications.  Current Outpatient Medications  Medication Sig Dispense Refill   benzonatate  (TESSALON ) 100 MG capsule Take 1 capsule (100 mg total) by mouth 3 (three) times daily as needed for cough. 21 capsule 0   chlorthalidone (  HYGROTON) 25 MG tablet Take 1 tablet by mouth daily.     ezetimibe (ZETIA) 10 MG tablet Take 10 mg by mouth daily.     lisinopril (ZESTRIL) 5 MG tablet Take 5 mg by mouth daily.     Multiple Vitamins-Minerals (MULTIVITAMIN ADULT) CHEW Chew by mouth.     ARIPiprazole  (ABILIFY ) 10 MG tablet Take 1 tablet (10 mg total) by mouth daily. 90 tablet 3   lamoTRIgine  (LAMICTAL ) 200 MG tablet Take 1 tablet (200 mg total) by mouth daily. 90  tablet 3   No current facility-administered medications for this visit.    Medication Side Effects: None  Allergies:  Allergies  Allergen Reactions   Amoxicillin Anaphylaxis    unknown   Amoxicillin-Pot Clavulanate Other (See Comments)    Past Medical History:  Diagnosis Date   Depression     Family History  Problem Relation Age of Onset   Hypertension Father     Social History   Socioeconomic History   Marital status: Divorced    Spouse name: Not on file   Number of children: Not on file   Years of education: Not on file   Highest education level: Not on file  Occupational History   Not on file  Tobacco Use   Smoking status: Former   Smokeless tobacco: Never  Vaping Use   Vaping status: Never Used  Substance and Sexual Activity   Alcohol use: No   Drug use: No   Sexual activity: Not on file  Other Topics Concern   Not on file  Social History Narrative   Not on file   Social Drivers of Health   Financial Resource Strain: Low Risk  (08/20/2023)   Received from [facility]   Overall Financial Resource Strain (CARDIA)    Difficulty of Paying Living Expenses: Not hard at all  Food Insecurity: No Food Insecurity (08/20/2023)   Received from [facility]   Hunger Vital Sign    Within the past 12 months, you worried that your food would run out before you got the money to buy more.: Never true    Within the past 12 months, the food you bought just didn't last and you didn't have money to get more.: Never true  Transportation Needs: No Transportation Needs (08/20/2023)   Received from [facility] - Transportation    In the past 12 months, has lack of transportation kept you from medical appointments or from getting medications?: No    Lack of Transportation (Non-Medical): No  Physical Activity: Not on file  Stress: Not on file  Social Connections: Not on file  Intimate Partner Violence: Not on file     Past Medical History, Surgical history, Social history, and Family history were reviewed and updated as appropriate.   Please see review of systems for further details on the patient's review from today.   Objective:   Physical Exam:  There were no vitals taken for this visit.  Physical Exam Constitutional:      General: He is not in acute distress.    Appearance: He is well-developed. He is obese.   Musculoskeletal:        General: No deformity.   Neurological:     Mental Status: He is alert and oriented to person, place, and time.     Cranial Nerves: No dysarthria.     Motor: No tremor.     Coordination: Coordination normal.   Psychiatric:  Attention and Perception: Attention and perception normal. He does not perceive auditory or visual hallucinations.        Mood and Affect: Mood normal. Mood is not anxious or depressed. Affect is not labile, blunt or angry.        Speech: Speech normal.        Behavior: Behavior normal. Behavior is not slowed. Behavior is cooperative.        Thought Content: Thought content normal. Thought content is not paranoid or delusional. Thought content does not include homicidal or suicidal ideation. Thought content does not include suicidal plan.        Cognition and Memory: Cognition and memory normal.        Judgment: Judgment normal.     Comments: Satisfied and good response.     Lab Review:     Component Value Date/Time   NA 136 12/06/2020 1110   NA 141 12/25/2011 0940   K 4.2 12/06/2020 1110   K 4.4 12/25/2011 0940   CL 103 12/06/2020 1110   CL 102 12/25/2011 0940   CO2 28 12/06/2020 1110   CO2 29 12/25/2011 0940   GLUCOSE 113 (H) 12/06/2020 1110   GLUCOSE 105 (H) 12/25/2011 0940   BUN 16 12/06/2020 1110   BUN 11 12/25/2011 0940   CREATININE 0.86 12/06/2020 1110   CREATININE 0.92 12/25/2011 0940   CALCIUM 9.0 12/06/2020 1110   CALCIUM 9.5 12/25/2011 0940   PROT 7.3 12/22/2019 0011   PROT 8.1 12/25/2011 0940    ALBUMIN 3.8 12/22/2019 0011   ALBUMIN 3.9 12/25/2011 0940   AST 40 12/22/2019 0011   AST 46 (H) 12/25/2011 0940   ALT 54 (H) 12/22/2019 0011   ALT 92 (H) 12/25/2011 0940   ALKPHOS 57 12/22/2019 0011   ALKPHOS 73 12/25/2011 0940   BILITOT 0.5 12/22/2019 0011   BILITOT 0.6 12/25/2011 0940   GFRNONAA >60 12/06/2020 1110   GFRNONAA >60 12/25/2011 0940   GFRAA >60 12/22/2019 0011   GFRAA >60 12/25/2011 0940       Component Value Date/Time   WBC 11.5 (H) 12/06/2020 1110   RBC 5.34 12/06/2020 1110   HGB 15.8 12/06/2020 1110   HGB 15.2 12/25/2011 0940   HCT 46.9 12/06/2020 1110   HCT 45.4 12/25/2011 0940   PLT 444 (H) 12/06/2020 1110   PLT 373 12/25/2011 0940   MCV 87.8 12/06/2020 1110   MCV 88 12/25/2011 0940   MCH 29.6 12/06/2020 1110   MCHC 33.7 12/06/2020 1110   RDW 13.2 12/06/2020 1110   RDW 13.9 12/25/2011 0940   LYMPHSABS 1.8 12/22/2019 0011   LYMPHSABS 3.0 12/25/2011 0940   MONOABS 1.5 (H) 12/22/2019 0011   MONOABS 1.2 (H) 12/25/2011 0940   EOSABS 0.5 12/22/2019 0011   EOSABS 0.3 12/25/2011 0940   BASOSABS 0.1 12/22/2019 0011   BASOSABS 0.1 12/25/2011 0940    No results found for: POCLITH, LITHIUM   No results found for: PHENYTOIN, PHENOBARB, VALPROATE, CBMZ   .res Assessment: Plan:    Bipolar I disorder (HCC) - Plan: ARIPiprazole  (ABILIFY ) 10 MG tablet, lamoTRIgine  (LAMICTAL ) 200 MG tablet  Generalized anxiety disorder  Obstructive sleep apnea   Alan Norris was first seen in our clinic in June 2019 diagnosed about 2016- 2017 at [facility].  Has continued in counseling as needed.  Good response to the increase in lamotrigine  while he was in [place]  helping take care of his father's back injury.  .  Satisfied with the medications now He's self  aware.    Discussed potential metabolic side effects associated with atypical antipsychotics, as well as potential risk for movement side effects. Advised pt to contact office if movement side  effects occur.  PCP monitors labs No AIM  Emphasized need for consistency with lamotrigine  because of the rash risk.  He understands that.  Encourage keep trying  with CPAP and explained brain health reasons to lose weight again.   He is back on CPAP usually .  Nights when it is a struggle.  Hopes to lose wt.  Using nasal pillows.  No med changes indicated today.  Continue lamotrigine  200 Abilify  10  Follow-up 12 months bc of stability or sooner as needed.  Nori Beat MD, DFAPA  Please see After Visit Summary for patient specific instructions.  No future appointments.   No orders of the defined types were placed in this encounter.     -------------------------------

## 2023-12-10 ENCOUNTER — Other Ambulatory Visit: Payer: Self-pay | Admitting: Otolaryngology

## 2023-12-10 DIAGNOSIS — H90A11 Conductive hearing loss, unilateral, right ear with restricted hearing on the contralateral side: Secondary | ICD-10-CM

## 2023-12-15 ENCOUNTER — Encounter: Payer: Self-pay | Admitting: Otolaryngology

## 2023-12-18 ENCOUNTER — Encounter: Payer: Self-pay | Admitting: Otolaryngology

## 2024-01-01 ENCOUNTER — Ambulatory Visit
Admission: RE | Admit: 2024-01-01 | Discharge: 2024-01-01 | Disposition: A | Discharge status: Home / Self Care | Source: Ambulatory Visit | Attending: Otolaryngology | Admitting: Otolaryngology

## 2024-01-01 DIAGNOSIS — H90A11 Conductive hearing loss, unilateral, right ear with restricted hearing on the contralateral side: Secondary | ICD-10-CM

## 2024-01-01 MED ORDER — GADOPICLENOL 0.5 MMOL/ML IV SOLN
10.0000 mL | Freq: Once | INTRAVENOUS | Status: AC | PRN
  Administered 2024-01-01: 10 mL via INTRAVENOUS

## 2024-01-14 ENCOUNTER — Emergency Department

## 2024-01-14 ENCOUNTER — Emergency Department
Admission: EM | Admit: 2024-01-14 | Discharge: 2024-01-14 | Disposition: A | Discharge status: Home / Self Care | Attending: Emergency Medicine | Admitting: Emergency Medicine

## 2024-01-14 ENCOUNTER — Other Ambulatory Visit: Payer: Self-pay

## 2024-01-14 ENCOUNTER — Encounter: Payer: Self-pay | Admitting: Emergency Medicine

## 2024-01-14 DIAGNOSIS — R079 Chest pain, unspecified: Secondary | ICD-10-CM | POA: Insufficient documentation

## 2024-01-14 DIAGNOSIS — R0602 Shortness of breath: Secondary | ICD-10-CM | POA: Diagnosis present

## 2024-01-14 DIAGNOSIS — R0600 Dyspnea, unspecified: Secondary | ICD-10-CM | POA: Diagnosis not present

## 2024-01-14 DIAGNOSIS — D72829 Elevated white blood cell count, unspecified: Secondary | ICD-10-CM | POA: Insufficient documentation

## 2024-01-14 DIAGNOSIS — I10 Essential (primary) hypertension: Secondary | ICD-10-CM | POA: Insufficient documentation

## 2024-01-14 HISTORY — DX: Essential (primary) hypertension: I10

## 2024-01-14 LAB — TROPONIN I (HIGH SENSITIVITY)
Troponin I (High Sensitivity): 7 ng/L (ref <18)
Troponin I (High Sensitivity): 6 ng/L (ref <18)

## 2024-01-14 LAB — BASIC METABOLIC PANEL WITH GFR
Anion gap: 9 (ref 5–15)
BUN: 17 mg/dL (ref 6–20)
CO2: 27 mmol/L (ref 22–32)
Calcium: 10 mg/dL (ref 8.9–10.3)
Chloride: 99 mmol/L (ref 98–111)
Creatinine, Ser: 0.89 mg/dL (ref 0.61–1.24)
GFR, Estimated: >60 mL/min (ref >60)
Glucose, Bld: 165 mg/dL — ABNORMAL HIGH (ref 70–99)
Potassium: 4.1 mmol/L (ref 3.5–5.1)
Sodium: 135 mmol/L (ref 135–145)

## 2024-01-14 LAB — CBC
HCT: 44.1 % (ref 39.0–52.0)
Hemoglobin: 14.7 g/dL (ref 13.0–17.0)
MCH: 29.9 pg (ref 26.0–34.0)
MCHC: 33.3 g/dL (ref 30.0–36.0)
MCV: 89.8 fL (ref 80.0–100.0)
Platelets: 432 K/uL — ABNORMAL HIGH (ref 150–400)
RBC: 4.91 MIL/uL (ref 4.22–5.81)
RDW: 13.2 % (ref 11.5–15.5)
WBC: 11.2 K/uL — ABNORMAL HIGH (ref 4.0–10.5)
nRBC: 0 % (ref 0.0–0.2)

## 2024-01-14 LAB — D-DIMER, QUANTITATIVE: D-Dimer, Quant: 0.44 ug{FEU}/mL (ref 0.00–0.50)

## 2024-01-14 NOTE — ED Triage Notes (Signed)
 Patient to ED via POV for centralized CP and SOB. Started this AM. Hx of HTN

## 2024-01-14 NOTE — ED Provider Notes (Signed)
 [facility] Provider Note    Event Date/Time   First MD Initiated Contact with Patient 01/14/24 1106     (approximate)  History   Chief Complaint: Chest Pain  HPI  Alan Norris is a 43 y.o. male with a past medical history of hypertension, presents to the emergency department for shortness of breath and chest discomfort.  According to the patient he states around 7 or 8:00 this morning he developed a shortness of breath sensation.  Patient denies any cough congestion or fever.  Patient states it felt like he could not take a full breath or that he had to concentrate to take a full breath.  Patient noted some slight discomfort in the chest, he states he has had similar discomfort in the chest previously, states he has had medical workups in the past for similar symptoms showing no findings.  Denies any history of asthma or COPD.  No history of heart disease or prior MI.  Patient denies any leg pain or swelling.  Physical Exam   Triage Vital Signs: ED Triage Vitals  Encounter Vitals Group     BP 01/14/24 1041 121/82     Girls Systolic BP Percentile --      Girls Diastolic BP Percentile --      Boys Systolic BP Percentile --      Boys Diastolic BP Percentile --      Pulse Rate 01/14/24 1041 95     Resp 01/14/24 1041 18     Temp 01/14/24 1041 98.2 F (36.8 C)     Temp Source 01/14/24 1041 Oral     SpO2 01/14/24 1041 96 %     Weight 01/14/24 1039 (!) 355 lb (161 kg)     Height 01/14/24 1039 6' 2 (1.88 m)     Head Circumference --      Peak Flow --      Pain Score 01/14/24 1039 6     Pain Loc --      Pain Education --      Exclude from Growth Chart --     Most recent vital signs: Vitals:   01/14/24 1041  BP: 121/82  Pulse: 95  Resp: 18  Temp: 98.2 F (36.8 C)  SpO2: 96%    General: Awake, no distress.  CV:  Good peripheral perfusion.  Regular rate and rhythm  Resp:  Normal effort.  Equal breath sounds bilaterally.  Abd:  No  distention.  Soft, nontender.  No rebound or guarding. Other:  No lower extremity edema or tenderness.   ED Results / Procedures / Treatments   EKG  EKG viewed and interpreted by myself shows a normal sinus rhythm at 100 bpm with a narrow QRS, normal axis, normal intervals, no concerning ST changes.  RADIOLOGY  I have reviewed interpret the chest x-ray images.  No consolidation on my evaluation. Radiology is read the x-ray as negative   MEDICATIONS ORDERED IN ED: Medications - No data to display   IMPRESSION / MDM / ASSESSMENT AND PLAN / ED COURSE  I reviewed the triage vital signs and the nursing notes.  Patient's presentation is most consistent with acute presentation with potential threat to life or bodily function.  Patient presents to the emergency department with chest discomfort and shortness of breath starting this morning.  Overall the patient appears well, denies any shortness of breath currently.  Vital signs are reassuring.  Patient's initial workup in the emergency department is reassuring as well  with a reassuring CBC normal chemistry and negative troponin.  However given the mild shortness of breath with chest discomfort experiences morning we will repeat a troponin as a precaution as well as check a D-dimer.  If these are negative I believe the patient could be discharged home and follow-up with his PCP.  Patient is agreeable to this plan.  Patient's workup is reassuring, repeat troponin remains negative.  D-dimer is negative.  Given the patient's reassuring workup reassuring exam and reassuring vitals I believe the patient safe for discharge home with outpatient follow-up.  Provided my typical dyspnea/chest pain return precautions.  Patient is very reassured by the workup.  FINAL CLINICAL IMPRESSION(S) / ED DIAGNOSES   Dyspnea Chest pain  Note:  This document was prepared using Dragon voice recognition software and may include unintentional dictation errors.    Dorothyann Drivers, MD 01/14/24 1310

## 2024-04-20 ENCOUNTER — Telehealth: Payer: Self-pay | Admitting: Psychiatry

## 2024-04-20 DIAGNOSIS — F319 Bipolar disorder, unspecified: Secondary | ICD-10-CM

## 2024-04-20 MED ORDER — LAMOTRIGINE 200 MG PO TABS
200.0000 mg | ORAL_TABLET | Freq: Every day | ORAL | 1 refills | Status: AC
Start: 2024-04-20 — End: ?

## 2024-04-20 NOTE — Telephone Encounter (Signed)
 Pt called at 9:47a stating he needs new script sent for Lamotrigine  to   [id number] [place], [place] - [address] [place], [place] [address] Phone: [phone]  Fax: [phone]   Original script was filled there, then the refill he had sent to a [facility] but he doesn't remember which one to have it transferred back to the [facility].  He just wants the remaining refills (2) sent to the [facility].  Next appt 6/15

## 2024-04-20 NOTE — Telephone Encounter (Signed)
 Two RF of Lamictal  sent to the requested pharmacy.

## 2024-11-22 ENCOUNTER — Ambulatory Visit: Admitting: Psychiatry
